# Patient Record
Sex: Female | Born: 1985 | Race: Black or African American | Hispanic: No | Marital: Single | Smoking: Never smoker
Health system: Southern US, Community
[De-identification: ages and names within clinical notes are randomized; demographics above are authoritative.]

## PROBLEM LIST (undated history)

## (undated) ENCOUNTER — Inpatient Hospital Stay (HOSPITAL_COMMUNITY): Payer: Self-pay

## (undated) DIAGNOSIS — L700 Acne vulgaris: Secondary | ICD-10-CM

## (undated) DIAGNOSIS — IMO0002 Reserved for concepts with insufficient information to code with codable children: Secondary | ICD-10-CM

## (undated) DIAGNOSIS — Z789 Other specified health status: Secondary | ICD-10-CM

## (undated) HISTORY — DX: Reserved for concepts with insufficient information to code with codable children: IMO0002

## (undated) HISTORY — PX: ROOT CANAL: SHX2363

## (undated) HISTORY — DX: Acne vulgaris: L70.0

## (undated) HISTORY — PX: COLPOSCOPY: SHX161

---
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
## 2007-05-18 ENCOUNTER — Emergency Department (HOSPITAL_COMMUNITY): Admission: EM | Admit: 2007-05-18 | Discharge: 2007-05-18 | Payer: Self-pay | Admitting: Emergency Medicine

## 2007-12-08 ENCOUNTER — Ambulatory Visit: Payer: Self-pay | Admitting: Family Medicine

## 2008-01-11 ENCOUNTER — Ambulatory Visit: Payer: Self-pay | Admitting: Family Medicine

## 2008-01-11 ENCOUNTER — Other Ambulatory Visit: Admission: RE | Admit: 2008-01-11 | Discharge: 2008-01-11 | Payer: Self-pay | Admitting: Family Medicine

## 2008-01-19 ENCOUNTER — Ambulatory Visit: Payer: Self-pay | Admitting: Family Medicine

## 2008-10-14 ENCOUNTER — Emergency Department (HOSPITAL_COMMUNITY): Admission: EM | Admit: 2008-10-14 | Discharge: 2008-10-14 | Payer: Self-pay | Admitting: Emergency Medicine

## 2011-03-20 ENCOUNTER — Encounter: Payer: Self-pay | Admitting: Family Medicine

## 2011-03-20 ENCOUNTER — Ambulatory Visit (INDEPENDENT_AMBULATORY_CARE_PROVIDER_SITE_OTHER): Payer: BC Managed Care – PPO | Admitting: Family Medicine

## 2011-03-20 VITALS — BP 120/76 | HR 76 | Ht 66.0 in | Wt 196.0 lb

## 2011-03-20 DIAGNOSIS — T192XXA Foreign body in vulva and vagina, initial encounter: Secondary | ICD-10-CM

## 2011-03-20 NOTE — Progress Notes (Signed)
Chief complaint:  Monday night had intercourse, thinks condom is in her vagina  HPI: Patient reports using a condom with intercourse on Monday night, but it seemed to have disappeared during intercourse.  Recognized in the midst of sex, denies that he ejaculated.  Denies any vaginal discharge, odor, itch or pelvic discomfort.    History reviewed. No pertinent past medical history.  History reviewed. No pertinent past surgical history.  History   Social History  . Marital Status: Single    Spouse Name: N/A    Number of Children: N/A  . Years of Education: N/A   Occupational History  . none    Social History Main Topics  . Smoking status: Never Smoker   . Smokeless tobacco: Never Used  . Alcohol Use: No  . Drug Use: No  . Sexually Active: Not on file   Other Topics Concern  . Not on file   Social History Narrative   Unemployed.  Lives with her father   No current outpatient prescriptions on file.  No Known Allergies  ROS: Denies fevers, nausea, vomiting, diarrhea, urinary complaints, URI symptoms or other concerns.  PHYSICAL EXAM: BP 120/76  Pulse 76  Ht 5\' 6"  (1.676 m)  Wt 196 lb (88.905 kg)  BMI 31.64 kg/m2  LMP 02/27/2011 Well developed, pleasant overweight female in no distress External genitalia normal.  BUS and vagina normal.  Bimanual exam--no cervical motion tenderness, normal uterus, no adnexal tenderness.  Condom was removed during bimanual exam  ASSESSMENT/PLAN: 1. Vaginal foreign body  PR REMOVAL OF FOREIGN BODY   Encouraged her to schedule a CPE/pap--per chart last was in 11/09 Discussed Plan B--likely not needed this time, due to lack of ejaculation and being >36 hours

## 2011-03-20 NOTE — Patient Instructions (Signed)
Continue to use condoms regularly for prevention of pregnancy and STD's.  If the condom should break, or come off, or not be used, please remember about Plan B (available behind the counter at pharmacy with a prescription), to prevent unwanted pregnancy.  Best if used within 36 hours, the earlier the better.

## 2011-04-15 ENCOUNTER — Other Ambulatory Visit (HOSPITAL_COMMUNITY)
Admission: RE | Admit: 2011-04-15 | Discharge: 2011-04-15 | Disposition: A | Discharge status: Home / Self Care | Payer: BC Managed Care – PPO | Source: Ambulatory Visit | Attending: Medical | Admitting: Medical

## 2011-04-15 ENCOUNTER — Ambulatory Visit (INDEPENDENT_AMBULATORY_CARE_PROVIDER_SITE_OTHER): Payer: BC Managed Care – PPO | Admitting: Medical

## 2011-04-15 ENCOUNTER — Encounter: Payer: Self-pay | Admitting: Medical

## 2011-04-15 VITALS — BP 100/70 | HR 80 | Temp 97.9°F | Resp 16 | Ht 66.0 in | Wt 191.0 lb

## 2011-04-15 DIAGNOSIS — Z01419 Encounter for gynecological examination (general) (routine) without abnormal findings: Secondary | ICD-10-CM | POA: Insufficient documentation

## 2011-04-15 DIAGNOSIS — Z87898 Personal history of other specified conditions: Secondary | ICD-10-CM | POA: Insufficient documentation

## 2011-04-15 DIAGNOSIS — Z8742 Personal history of other diseases of the female genital tract: Secondary | ICD-10-CM

## 2011-04-15 DIAGNOSIS — Z1159 Encounter for screening for other viral diseases: Secondary | ICD-10-CM | POA: Insufficient documentation

## 2011-04-15 DIAGNOSIS — L708 Other acne: Secondary | ICD-10-CM

## 2011-04-15 DIAGNOSIS — Z113 Encounter for screening for infections with a predominantly sexual mode of transmission: Secondary | ICD-10-CM | POA: Insufficient documentation

## 2011-04-15 DIAGNOSIS — Z7185 Encounter for immunization safety counseling: Secondary | ICD-10-CM

## 2011-04-15 DIAGNOSIS — Z7189 Other specified counseling: Secondary | ICD-10-CM | POA: Insufficient documentation

## 2011-04-15 DIAGNOSIS — Z0000 Encounter for general adult medical examination without abnormal findings: Secondary | ICD-10-CM

## 2011-04-15 DIAGNOSIS — L709 Acne, unspecified: Secondary | ICD-10-CM

## 2011-04-15 LAB — POCT URINALYSIS DIPSTICK
Bilirubin, UA: NEGATIVE
Blood, UA: NEGATIVE
Glucose, UA: NEGATIVE
Spec Grav, UA: <=1.005
Urobilinogen, UA: NEGATIVE

## 2011-04-15 LAB — CBC WITH DIFFERENTIAL/PLATELET
Basophils Absolute: 0 10*3/uL (ref 0.0–0.1)
Eosinophils Absolute: 0.1 10*3/uL (ref 0.0–0.7)
Eosinophils Relative: 1 % (ref 0–5)
HCT: 39.3 % (ref 36.0–46.0)
Lymphocytes Relative: 42 % (ref 12–46)
MCH: 29.3 pg (ref 26.0–34.0)
MCHC: 33.6 g/dL (ref 30.0–36.0)
MCV: 87.3 fL (ref 78.0–100.0)
Monocytes Absolute: 0.4 10*3/uL (ref 0.1–1.0)
Platelets: 332 10*3/uL (ref 150–400)
RDW: 12.3 % (ref 11.5–15.5)

## 2011-04-15 LAB — COMPREHENSIVE METABOLIC PANEL
Albumin: 4.4 g/dL (ref 3.5–5.2)
BUN: 7 mg/dL (ref 6–23)
CO2: 22 mEq/L (ref 19–32)
Chloride: 104 mEq/L (ref 96–112)
Glucose, Bld: 82 mg/dL (ref 70–99)
Potassium: 4.2 mEq/L (ref 3.5–5.3)

## 2011-04-15 LAB — LIPID PANEL
HDL: 38 mg/dL — ABNORMAL LOW (ref >39)
LDL Cholesterol: 114 mg/dL — ABNORMAL HIGH (ref 0–99)

## 2011-04-15 MED ORDER — MINOCYCLINE HCL 100 MG PO TABS: 100.0000 mg | ORAL_TABLET | Freq: Two times a day (BID) | ORAL | Status: AC

## 2011-04-15 NOTE — Progress Notes (Addendum)
Subjective:   HPI  Kayla Reilly is a 26 y.o. female who presents for a complete physical.  Doing well in general, but would like medication for acne.  She notes ongoing problems with acne, scarring, red bumps at times, on face, chest and back.  She saw dermatology once for this, but couldn't afford the medication.  Using OTC soaps and black ambi soap.  She notes hx/o abnormal pap smears, saw gynecology, had colposcopy and then things came back normal.  Last pap 2009.    Reviewed their medical, surgical, family, social, medication, and allergy history and updated chart as appropriate.  Past Medical History  Diagnosis Date  . Acne vulgaris   . Abnormal Pap smear     prior    Past Surgical History  Procedure Date  . Root canal   . Colposcopy     Family History  Problem Relation Age of Onset  . Hypertension Mother   . Diabetes Mother   . Cancer Maternal Grandmother     cancer  . Heart disease Neg Hx   . Stroke Neg Hx     History   Social History  . Marital Status: Single    Spouse Name: N/A    Number of Children: N/A  . Years of Education: N/A   Occupational History  . none    Social History Main Topics  . Smoking status: Never Smoker   . Smokeless tobacco: Never Used  . Alcohol Use: No  . Drug Use: No  . Sexually Active: Not on file   Other Topics Concern  . Not on file   Social History Narrative   Unemployed.  Lives with her father.  Not exercising.  Wants to go back to school for psychology.    No current outpatient prescriptions on file prior to visit.    No Known Allergies  Review of Systems Constitutional: -fever, -chills, -sweats, -unexpected weight change, -anorexia, -fatigue Allergy: -sneezing, -itching, -congestion Dermatology: denies changing moles, rash, lumps, new worrisome lesions ENT: -runny nose, -ear pain, -sore throat, -hoarseness, -sinus pain, -teeth pain, -tinnitus, -hearing loss, -epistaxis Cardiology:  -chest pain, -palpitations,  -edema, -orthopnea, -paroxysmal nocturnal dyspnea Respiratory: -cough, -shortness of breath, -dyspnea on exertion, -wheezing, -hemoptysis Gastroenterology: -abdominal pain, -nausea, -vomiting, -diarrhea, -constipation, -blood in stool, -changes in bowel movement, -dysphagia Hematology: -bleeding or bruising problems Musculoskeletal: -arthralgias, -myalgias, -joint swelling, -back pain, -neck pain, -cramping, -gait changes Ophthalmology: -vision changes, -eye redness, -itching, -discharge Urology: -dysuria, -difficulty urinating, -hematuria, -urinary frequency, -urgency, incontinence Neurology: -headache, -weakness, -tingling, -numbness, -speech abnormality, -memory loss, -falls, -dizziness Psychology:  -depressed mood, -agitation, -sleep problems      Objective:   Physical Exam  Filed Vitals:   04/15/11 1344  BP: 100/70  Pulse: 80  Temp: 97.9 F (36.6 C)  Resp: 16    General appearance: alert, no distress, WD/WN, overweight black female Skin: scarring on face and arms and upper chest from likely acne vulgaris, several small erythematous papular lesions in similar area of face and chest and arms HEENT: normocephalic, conjunctiva/corneas normal, sclerae anicteric, PERRLA, EOMi, nares patent, no discharge or erythema, pharynx normal Oral cavity: MMM, tongue normal, teeth normal Neck: supple, no lymphadenopathy, no thyromegaly, no masses, normal ROM Chest: non tender, normal shape and expansion Heart: RRR, normal S1, S2, no murmurs Lungs: CTA bilaterally, no wheezes, rhonchi, or rales Abdomen: +bs, soft, non tender, non distended, no masses, no hepatomegaly, no splenomegaly, no bruits Back: non tender, normal ROM, no scoliosis Musculoskeletal: upper extremities non tender,  no obvious deformity, normal ROM throughout, lower extremities non tender, no obvious deformity, normal ROM throughout Extremities: no edema, no cyanosis, no clubbing Pulses: 2+ symmetric, upper and lower  extremities, normal cap refill Neurological: alert, oriented x 3, CN2-12 intact, strength normal upper extremities and lower extremities, sensation normal throughout, DTRs 2+ throughout, no cerebellar signs, gait normal Psychiatric: normal affect, behavior normal, pleasant  Breast: nontender, no masses or lumps, no skin changes, no nipple discharge or inversion, no axillary lymphadenopathy Gyn: Normal external genitalia without lesions, vagina with normal mucosa, cervix with erythema, no cervical motion tenderness, whitish discharge.  Uterus and adnexa not enlarged, nontender, no masses.  Pap performed.  Exam chaperoned by nurse. Rectal: deferred    Assessment and Plan :    Encounter Diagnoses  Name Primary?  . Routine general medical examination at a health care facility Yes  . Acne   . History of abnormal Pap smear   . Screen for STD (sexually transmitted disease)   . HPV vaccine counseling      Physical exam - discussed healthy lifestyle, diet, exercise, preventative care, vaccinations, and addressed their concerns.  Handout given.  She was not interested in OCPs at this time.  Acne - trial of Minocycline, discussed daily facial and chest wash, OTC Benzoyl peroxide cleanser, recheck 31mo.  Records request from gynecology.   Pap smear today.  STD screening today, discussed safe sex.  Strongly recommended HPV vaccine today.   Discussed risks/ benefits, but she declines vaccines.    Follow-up pending labs.

## 2011-04-15 NOTE — Patient Instructions (Addendum)
Acne Acne is a common skin problem. Up to 80% of people get acne at some time, especially from ages 27 years to 24 years. Acne occurs when oil glands get blocked, become red (inflamed) or infected.  CAUSES  Hair follicles have glands that make an oily material called sebum. Acne happens when these glands get plugged with sebum and skin cells. Germs (bacteria) that are normally found in the oil glands can multiply. The main cause of acne is the change in hormones during adolescence. This causes the oil glands to get bigger and to make more sebum.  Other causes or things that can make acne worse include:  Hormone changes with women's menstrual cycles.   Oil based cosmetics and hair products.   Harshly scrubbing the skin.   Strong soaps or astringents.   Stress.   Heredity.   Hormone problems due to certain diseases.   Long or oily hair rubbing against the skin.   Some medicines.   Pressure from headbands, backpacks, or shoulder pads.   Job exposure to certain oils and chemicals.  SYMPTOMS  Acne often occurs where there are concentrations of oil glands, such as the face, neck, chest, and upper back. Symptoms often include:  Red pimples.   Whiteheads.   Blackheads.   Small pus-filled pimples (pustules).   Bigger red pimples or pustules that cause tenderness.  More severe acne can cause:  Boils.   Fluid filled swellings (cysts).   Scars.  HOME CARE INSTRUCTIONS  Acne usually disappears with time. Good skin care is the most important part of treatment:  Wash the skin gently at least twice a day and after exercise. Always wash your skin before bed.   Use mild soap.   After each wash, apply a non-oil based skin moisturizer. (Especially if you have dry skin).   Keep hair clean and off the face, and shampoo it daily.   Only use treatments prescribed by your caregiver.   Use a good sun block (SPF over 15). This is especially important when you use acne medicines.    Be patient with treatments. It can take 2 months before acne gets better.   Use cosmetics that are noncomedogenic. This means that they do not plug the oil glands.   Avoid things that make acne worse:   Leaning your chin or forehead on your hands.   Picking or squeezing pimples.  TREATMENT  There are many good treatments for acne. Some are available over-the-counter and some are available with a prescription. Treatment depends on:  The type of acne, such as red pimples or whiteheads.   How severe the acne is.  Common treatments include:  Creams and lotions that:   Prevent clogging of oil glands.   Treat or prevent infection and inflammation.   Antibiotics, put on the skin or taken as a pill.   Pills that decrease sebum production.   Birth control pills.   Special lights or lasers.   Minor surgery.   Injection of medicine into acne areas.   Chemicals that cause peeling of the skin.  SEEK MEDICAL CARE IF:   Acne is not better in 8 weeks.   Acne is worse.   A larger area of skin that is red or tender (may mean infection).  Document Released: 02/16/2000 Document Revised: 10/31/2010 Document Reviewed: 02/09/2008 [facility] Patient Information 2012 [place], [place].     Preventative Care for Adults - Female      MAINTAIN REGULAR HEALTH EXAMS:  A routine yearly  physical is a good way to check in with your primary care provider about your health and preventive screening. It is also an opportunity to share updates about your health and any concerns you have, and receive a thorough all-over exam.   Most health insurance companies pay for at least some preventative services.  Check with your health plan for specific coverages.  WHAT PREVENTATIVE SERVICES DO WOMEN NEED?  Adult women should have their weight and blood pressure checked regularly.   Women age 49 and older should have their cholesterol levels checked regularly.  Women should be screened for cervical  cancer with a Pap smear and pelvic exam beginning at either age 38, or 3 years after they become sexually activity.    Breast cancer screening generally begins at age 75 with a mammogram and breast exam by your primary care provider.    Beginning at age 44 and continuing to age 8, women should be screened for colorectal cancer.  Certain people may need continued testing until age 64.  Updating vaccinations is part of preventative care.  Vaccinations help protect against diseases such as the flu.  Osteoporosis is a disease in which the bones lose minerals and strength as we age. Women ages 62 and over should discuss this with their caregivers, as should women after menopause who have other risk factors.  Lab tests are generally done as part of preventative care to screen for anemia and blood disorders, to screen for problems with the kidneys and liver, to screen for bladder problems, to check blood sugar, and to check your cholesterol level.  Preventative services generally include counseling about diet, exercise, avoiding tobacco, drugs, excessive alcohol consumption, and sexually transmitted infections.    GENERAL RECOMMENDATIONS FOR GOOD HEALTH:  Healthy diet:  Eat a variety of foods, including fruit, vegetables, animal or vegetable protein, such as meat, fish, chicken, and eggs, or beans, lentils, tofu, and grains, such as rice.  Drink plenty of water daily.  Decrease saturated fat in the diet, avoid lots of red meat, processed foods, sweets, fast foods, and fried foods.  Exercise:  Aerobic exercise helps maintain good heart health. At least 30-40 minutes of moderate-intensity exercise is recommended. For example, a brisk walk that increases your heart rate and breathing. This should be done on most days of the week.   Find a type of exercise or a variety of exercises that you enjoy so that it becomes a part of your daily life.  Examples are running, walking, swimming, water aerobics,  and biking.  For motivation and support, explore group exercise such as aerobic class, spin class, Zumba, Yoga,or  martial arts, etc.    Set exercise goals for yourself, such as a certain weight goal, walk or run in a race such as a 5k walk/run.  Speak to your primary care provider about exercise goals.  Disease prevention:  If you smoke or chew tobacco, find out from your caregiver how to quit. It can literally save your life, no matter how long you have been a tobacco user. If you do not use tobacco, never begin.   Maintain a healthy diet and normal weight. Increased weight leads to problems with blood pressure and diabetes.   The Body Mass Index or BMI is a way of measuring how much of your body is fat. Having a BMI above 27 increases the risk of heart disease, diabetes, hypertension, stroke and other problems related to obesity. Your caregiver can help determine your BMI and based  on it develop an exercise and dietary program to help you achieve or maintain this important measurement at a healthful level.  High blood pressure causes heart and blood vessel problems.  Persistent high blood pressure should be treated with medicine if weight loss and exercise do not work.   Fat and cholesterol leaves deposits in your arteries that can block them. This causes heart disease and vessel disease elsewhere in your body.  If your cholesterol is found to be high, or if you have heart disease or certain other medical conditions, then you may need to have your cholesterol monitored frequently and be treated with medication.   Ask if you should have a cardiac stress test if your history suggests this. A stress test is a test done on a treadmill that looks for heart disease. This test can find disease prior to there being a problem.  Menopause can be associated with physical symptoms and risks. Hormone replacement therapy is available to decrease these. You should talk to your caregiver about whether starting  or continuing to take hormones is right for you.   Osteoporosis is a disease in which the bones lose minerals and strength as we age. This can result in serious bone fractures. Risk of osteoporosis can be identified using a bone density scan. Women ages 38 and over should discuss this with their caregivers, as should women after menopause who have other risk factors. Ask your caregiver whether you should be taking a calcium supplement and Vitamin D, to reduce the rate of osteoporosis.   Avoid drinking alcohol in excess (more than two drinks per day).  Avoid use of street drugs. Do not share needles with anyone. Ask for professional help if you need assistance or instructions on stopping the use of alcohol, cigarettes, and/or drugs.  Brush your teeth twice a day with fluoride toothpaste, and floss once a day. Good oral hygiene prevents tooth decay and gum disease. The problems can be painful, unattractive, and can cause other health problems. Visit your dentist for a routine oral and dental check up and preventive care every 6-12 months.   Look at your skin regularly.  Use a mirror to look at your back. Notify your caregivers of changes in moles, especially if there are changes in shapes, colors, a size larger than a pencil eraser, an irregular border, or development of new moles.  Safety:  Use seatbelts 100% of the time, whether driving or as a passenger.  Use safety devices such as hearing protection if you work in environments with loud noise or significant background noise.  Use safety glasses when doing any work that could send debris in to the eyes.  Use a helmet if you ride a bike or motorcycle.  Use appropriate safety gear for contact sports.  Talk to your caregiver about gun safety.  Use sunscreen with a SPF (or skin protection factor) of 15 or greater.  Lighter skinned people are at a greater risk of skin cancer. Don't forget to also wear sunglasses in order to protect your eyes from too much  damaging sunlight. Damaging sunlight can accelerate cataract formation.   Practice safe sex. Use condoms. Condoms are used for birth control and to help reduce the spread of sexually transmitted infections (or STIs).  Some of the STIs are gonorrhea (the clap), chlamydia, syphilis, trichomonas, herpes, HPV (human papilloma virus) and HIV (human immunodeficiency virus) which causes AIDS. The herpes, HIV and HPV are viral illnesses that have no cure. These can result  in disability, cancer and death.   Keep carbon monoxide and smoke detectors in your home functioning at all times. Change the batteries every 6 months or use a model that plugs into the wall.   Vaccinations:  Stay up to date with your tetanus shots and other required immunizations. You should have a booster for tetanus every 10 years. Be sure to get your flu shot every year, since 5%-20% of the [place] population comes down with the flu. The flu vaccine changes each year, so being vaccinated once is not enough. Get your shot in the fall, before the flu season peaks.   Other vaccines to consider:  Human Papilloma Virus or HPV causes cancer of the cervix, and other infections that can be transmitted from person to person. There is a vaccine for HPV, and females should get immunized between the ages of 21 and 98. It requires a series of 3 shots.   Pneumococcal vaccine to protect against certain types of pneumonia.  This is normally recommended for adults age 24 or older.  However, adults younger than 24 years old with certain underlying conditions such as diabetes, heart or lung disease should also receive the vaccine.  Shingles vaccine to protect against Varicella Zoster if you are older than age 22, or younger than 24 years old with certain underlying illness.  Hepatitis A vaccine to protect against a form of infection of the liver by a virus acquired from food.  Hepatitis B vaccine to protect against a form of infection of the liver by a  virus acquired from blood or body fluids, particularly if you work in health care.  If you plan to travel internationally, check with your local health department for specific vaccination recommendations.  Cancer Screening:  Breast cancer screening is essential to preventive care for women. All women age 82 and older should perform a breast self-exam every month. At age 40 and older, women should have their caregiver complete a breast exam each year. Women at ages 41 and older should have a mammogram (x-ray film) of the breasts. Your caregiver can discuss how often you need mammograms.    Cervical cancer screening includes taking a Pap smear (sample of cells examined under a microscope) from the cervix (end of the uterus). It also includes testing for HPV (Human Papilloma Virus, which can cause cervical cancer). Screening and a pelvic exam should begin at age 29, or 3 years after a woman becomes sexually active. Screening should occur every year, with a Pap smear but no HPV testing, up to age 36. After age 96, you should have a Pap smear every 3 years with HPV testing, if no HPV was found previously.   Most routine colon cancer screening begins at the age of 61. On a yearly basis, doctors may provide special easy to use take-home tests to check for hidden blood in the stool. Sigmoidoscopy or colonoscopy can detect the earliest forms of colon cancer and is life saving. These tests use a small camera at the end of a tube to directly examine the colon. Speak to your caregiver about this at age 51, when routine screening begins (and is repeated every 5 years unless early forms of pre-cancerous polyps or small growths are found).

## 2011-04-16 LAB — TSH: TSH: 0.966 u[IU]/mL (ref 0.350–4.500)

## 2011-04-16 LAB — HIV ANTIBODY (ROUTINE TESTING W REFLEX): HIV: NONREACTIVE

## 2011-04-17 ENCOUNTER — Telehealth: Payer: Self-pay | Admitting: Family Medicine

## 2011-04-17 NOTE — Telephone Encounter (Signed)
Done

## 2011-04-19 ENCOUNTER — Other Ambulatory Visit: Payer: Self-pay | Admitting: Medical

## 2011-04-19 MED ORDER — DOXYCYCLINE HYCLATE 100 MG PO TABS: 100.0000 mg | ORAL_TABLET | Freq: Two times a day (BID) | ORAL | Status: AC

## 2011-04-19 MED ORDER — METRONIDAZOLE 500 MG PO TABS: 500.0000 mg | ORAL_TABLET | Freq: Two times a day (BID) | ORAL | Status: AC

## 2011-07-02 ENCOUNTER — Encounter: Payer: Self-pay | Admitting: Obstetrics and Gynecology

## 2011-08-22 ENCOUNTER — Ambulatory Visit: Payer: BC Managed Care – PPO | Admitting: Family Medicine

## 2011-10-06 ENCOUNTER — Encounter (HOSPITAL_COMMUNITY): Payer: Self-pay | Admitting: Emergency Medicine

## 2011-10-06 ENCOUNTER — Emergency Department (HOSPITAL_COMMUNITY)
Admission: EM | Admit: 2011-10-06 | Discharge: 2011-10-07 | Disposition: A | Discharge status: Home / Self Care | Payer: BC Managed Care – PPO | Attending: Emergency Medicine | Admitting: Emergency Medicine

## 2011-10-06 DIAGNOSIS — T7840XA Allergy, unspecified, initial encounter: Secondary | ICD-10-CM

## 2011-10-06 DIAGNOSIS — R21 Rash and other nonspecific skin eruption: Secondary | ICD-10-CM | POA: Insufficient documentation

## 2011-10-06 DIAGNOSIS — L509 Urticaria, unspecified: Secondary | ICD-10-CM | POA: Insufficient documentation

## 2011-10-06 MED ORDER — DIPHENHYDRAMINE HCL 25 MG PO CAPS
25.0000 mg | ORAL_CAPSULE | Freq: Once | ORAL | Status: AC
  Administered 2011-10-07: 25 mg via ORAL
  Filled 2011-10-06: qty 1

## 2011-10-06 MED ORDER — FAMOTIDINE 20 MG PO TABS
20.0000 mg | ORAL_TABLET | Freq: Once | ORAL | Status: AC
  Administered 2011-10-07: 20 mg via ORAL
  Filled 2011-10-06: qty 1

## 2011-10-06 MED ORDER — PREDNISONE 20 MG PO TABS
40.0000 mg | ORAL_TABLET | Freq: Once | ORAL | Status: AC
  Administered 2011-10-07: 40 mg via ORAL
  Filled 2011-10-06: qty 2

## 2011-10-06 NOTE — ED Notes (Signed)
Pt c/o swollen areas to R FA, hives noted with c/o itching onset 0500. PWD

## 2011-10-06 NOTE — ED Provider Notes (Signed)
History     CSN: [id number]  Arrival date & time 10/06/11  2028   First MD Initiated Contact with Patient 10/06/11 2225      Chief Complaint  Patient presents with  . Urticaria    (Consider location/radiation/quality/duration/timing/severity/associated sxs/prior treatment) HPI Comments: Patient reports that she noticed hives on her arms and trunk yesterday.  She has not tried taking Benadryl or anything else for her symptoms.  She denies any feeling of her throat closing.  No swelling of her lips or tongue.  No shortness of breath.  She denies any new lotions, soaps, or detergents.  No new foods.  No new medications.  No prior history of allergic reactions.    The history is provided by the patient.    Past Medical History  Diagnosis Date  . Acne vulgaris   . Abnormal Pap smear     prior    Past Surgical History  Procedure Date  . Root canal   . Colposcopy     Family History  Problem Relation Age of Onset  . Hypertension Mother   . Diabetes Mother   . Cancer Maternal Grandmother     cancer  . Heart disease Neg Hx   . Stroke Neg Hx     History  Substance Use Topics  . Smoking status: Never Smoker   . Smokeless tobacco: Never Used  . Alcohol Use: No    OB History    Grav Para Term Preterm Abortions TAB SAB Ect Mult Living   0 0 0 0 0 0 0 0 0 0       Review of Systems  Constitutional: Negative for fever and chills.  HENT: Negative for sore throat, facial swelling, trouble swallowing and voice change.   Gastrointestinal: Negative for nausea and vomiting.  Musculoskeletal: Negative for joint swelling.  Skin: Positive for rash.    Allergies  Review of patient's allergies indicates no known allergies.  Home Medications  No current outpatient prescriptions on file.  BP 145/97  Pulse 102  Temp 99 F (37.2 C) (Oral)  Resp 18  SpO2 99%  LMP 09/26/2011  Physical Exam  Nursing note and vitals reviewed. Constitutional: She is oriented to person, place,  and time. She appears well-developed and well-nourished. No distress.  HENT:  Head: Normocephalic and atraumatic.  Mouth/Throat: Oropharynx is clear and moist and mucous membranes are normal.       No sign of airway obstruction. No edema of face, eyelids, lips, tongue, uvula.Marland Kitchen Uvula midline, no nasal congestion or drooling.  Tongue not elevated. No trismus.  Neck: Trachea normal, normal range of motion and full passive range of motion without pain. Neck supple.       No stridor  Cardiovascular: Normal rate, regular rhythm, intact distal pulses and normal pulses.        Not tachycardic  Pulmonary/Chest: Effort normal and breath sounds normal. No stridor. She has no wheezes.  Musculoskeletal: Normal range of motion.  Neurological: She is alert and oriented to person, place, and time.  Skin: Skin is warm and intact. Rash noted. Rash is urticarial. She is not diaphoretic.       Not diaphoretic. Raised erythematous welts, pruritic in nature, located on both arms and the trunk. Blanchable urticaria, no petechiae or purpura.   Psychiatric: She has a normal mood and affect. Her behavior is normal.    ED Course  Procedures (including critical care time)  Labs Reviewed - No data to display No results found.  No diagnosis found.    MDM  Patient re-evaluated prior to dc, is hemodynamically stable, in no respiratory distress, and denies the feeling of throat closing. Pt has been advised to take OTC benadryl & return to the ED if they have a mod-severe allergic rxn (s/s including throat closing, difficulty breathing, swelling of lips face or tongue). Pt is to follow up with their PCP. Pt is agreeable with plan & verbalizes understanding.  Patient discharged home with short course of Prednisone.        Pascal Lux [place], PA-C 10/07/11 [phone]

## 2011-10-07 MED ORDER — PREDNISONE 20 MG PO TABS: 40.0000 mg | ORAL_TABLET | Freq: Every day | ORAL | Status: AC

## 2011-10-07 NOTE — ED Provider Notes (Signed)
Medical screening examination/treatment/procedure(s) were performed by non-physician practitioner and as supervising physician I was immediately available for consultation/collaboration.  Doug Sou, MD 10/07/11 1500

## 2013-01-01 ENCOUNTER — Other Ambulatory Visit (HOSPITAL_COMMUNITY)
Admission: RE | Admit: 2013-01-01 | Discharge: 2013-01-01 | Disposition: A | Discharge status: Home / Self Care | Payer: BC Managed Care – PPO | Source: Ambulatory Visit | Attending: Medical | Admitting: Medical

## 2013-01-01 ENCOUNTER — Ambulatory Visit (INDEPENDENT_AMBULATORY_CARE_PROVIDER_SITE_OTHER): Payer: BC Managed Care – PPO | Admitting: Medical

## 2013-01-01 ENCOUNTER — Encounter: Payer: Self-pay | Admitting: Medical

## 2013-01-01 VITALS — BP 112/80 | HR 87 | Temp 98.1°F | Resp 16 | Ht 66.0 in | Wt 196.0 lb

## 2013-01-01 DIAGNOSIS — B977 Papillomavirus as the cause of diseases classified elsewhere: Secondary | ICD-10-CM

## 2013-01-01 DIAGNOSIS — Z8742 Personal history of other diseases of the female genital tract: Secondary | ICD-10-CM

## 2013-01-01 DIAGNOSIS — N760 Acute vaginitis: Secondary | ICD-10-CM | POA: Insufficient documentation

## 2013-01-01 DIAGNOSIS — Z113 Encounter for screening for infections with a predominantly sexual mode of transmission: Secondary | ICD-10-CM

## 2013-01-01 DIAGNOSIS — Z1151 Encounter for screening for human papillomavirus (HPV): Secondary | ICD-10-CM | POA: Insufficient documentation

## 2013-01-01 DIAGNOSIS — IMO0002 Reserved for concepts with insufficient information to code with codable children: Secondary | ICD-10-CM

## 2013-01-01 DIAGNOSIS — L708 Other acne: Secondary | ICD-10-CM

## 2013-01-01 DIAGNOSIS — Z01419 Encounter for gynecological examination (general) (routine) without abnormal findings: Secondary | ICD-10-CM | POA: Insufficient documentation

## 2013-01-01 DIAGNOSIS — Z124 Encounter for screening for malignant neoplasm of cervix: Secondary | ICD-10-CM

## 2013-01-01 DIAGNOSIS — L709 Acne, unspecified: Secondary | ICD-10-CM

## 2013-01-01 DIAGNOSIS — Z0000 Encounter for general adult medical examination without abnormal findings: Secondary | ICD-10-CM

## 2013-01-01 DIAGNOSIS — Z87898 Personal history of other specified conditions: Secondary | ICD-10-CM

## 2013-01-01 DIAGNOSIS — E669 Obesity, unspecified: Secondary | ICD-10-CM | POA: Insufficient documentation

## 2013-01-01 DIAGNOSIS — R8789 Other abnormal findings in specimens from female genital organs: Secondary | ICD-10-CM

## 2013-01-01 LAB — POCT URINALYSIS DIPSTICK
Ketones, UA: NEGATIVE
Leukocytes, UA: NEGATIVE
Protein, UA: NEGATIVE
Spec Grav, UA: 1.01
Urobilinogen, UA: NEGATIVE
pH, UA: 6

## 2013-01-01 LAB — CBC WITH DIFFERENTIAL/PLATELET
Basophils Absolute: 0 10*3/uL (ref 0.0–0.1)
Basophils Relative: 0 % (ref 0–1)
Eosinophils Relative: 2 % (ref 0–5)
HCT: 38.7 % (ref 36.0–46.0)
MCHC: 33.9 g/dL (ref 30.0–36.0)
MCV: 87.2 fL (ref 78.0–100.0)
Monocytes Absolute: 0.4 10*3/uL (ref 0.1–1.0)
RDW: 12.9 % (ref 11.5–15.5)

## 2013-01-01 LAB — COMPREHENSIVE METABOLIC PANEL
AST: 13 U/L (ref 0–37)
Alkaline Phosphatase: 54 U/L (ref 39–117)
BUN: 9 mg/dL (ref 6–23)
Creat: 0.72 mg/dL (ref 0.50–1.10)
Total Bilirubin: 0.3 mg/dL (ref 0.3–1.2)

## 2013-01-01 MED ORDER — MINOCYCLINE HCL 100 MG PO CAPS: 100.0000 mg | ORAL_CAPSULE | Freq: Two times a day (BID) | ORAL | Status: DC

## 2013-01-01 MED ORDER — CLINDAMYCIN PHOS-BENZOYL PEROX 1-5 % EX GEL: Freq: Two times a day (BID) | CUTANEOUS | Status: DC

## 2013-01-01 NOTE — Progress Notes (Signed)
Subjective:   HPI  Kayla Reilly is a 27 y.o. female who presents for a complete physical.  Last physical 04/2011.  Been in usual state of health.     Preventative care: Last ophthalmology visit:n/a Last dental visit:n/a Last colonoscopy:n/.a Last mammogram:n/a Last gynecological exam:04/2011 Last EKG: Last labs:04/2011  Prior vaccinations: TD or Tdap:up to date Influenza:will get at school Pneumococcal:n/a Shingles/Zostavax:n/a  Concerns: Acne.   Last visit did well on Minocycline.  Would like to go back on this.   Reviewed their medical, surgical, family, social, medication, and allergy history and updated chart as appropriate.  Past Medical History  Diagnosis Date  . Acne vulgaris   . Abnormal Pap smear     prior    Past Surgical History  Procedure Laterality Date  . Root canal    . Colposcopy      History   Social History  . Marital Status: Single    Spouse Name: N/A    Number of Children: N/A  . Years of Education: N/A   Occupational History  . none    Social History Main Topics  . Smoking status: Never Smoker   . Smokeless tobacco: Never Used  . Alcohol Use: No  . Drug Use: No  . Sexual Activity: Not on file   Other Topics Concern  . Not on file   Social History Narrative   Unemployed.  Lives with her father.  Walking for exercise.  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  One significant other x 1 year as of 10/14.    Family History  Problem Relation Age of Onset  . Hypertension Mother   . Diabetes Mother   . Cancer Maternal Grandmother     cancer  . Heart disease Neg Hx   . Stroke Neg Hx     No current outpatient prescriptions on file.  No Known Allergies   Review of Systems Constitutional: -fever, -chills, -sweats, -unexpected weight change, -decreased appetite, -fatigue Allergy: -sneezing, -itching, -congestion Dermatology: -changing moles, --rash, -lumps ENT: -runny nose, -ear pain, -sore throat,  -hoarseness, -sinus pain, -teeth pain, - ringing in ears, -hearing loss, -nosebleeds Cardiology: -chest pain, -palpitations, -swelling, -difficulty breathing when lying flat, -waking up short of breath Respiratory: -cough, -shortness of breath, -difficulty breathing with exercise or exertion, -wheezing, -coughing up blood Gastroenterology: -abdominal pain, -nausea, -vomiting, -diarrhea, -constipation, -blood in stool, -changes in bowel movement, -difficulty swallowing or eating Hematology: -bleeding, -bruising  Musculoskeletal: -joint aches, -muscle aches, -joint swelling, -back pain, -neck pain, -cramping, -changes in gait Ophthalmology: denies vision changes, eye redness, itching, discharge Urology: -burning with urination, -difficulty urinating, -blood in urine, -urinary frequency, -urgency, -incontinence Neurology: -headache, -weakness, -tingling, -numbness, -memory loss, -falls, -dizziness Psychology: -depressed mood, -agitation, -sleep problems     Objective:   Physical Exam  BP 112/80  Pulse 87  Temp(Src) 98.1 F (36.7 C) (Oral)  Resp 16  Ht 5\' 6"  (1.676 m)  Wt 196 lb (88.905 kg)  BMI 31.65 kg/m2  LMP 12/25/2012  General appearance: alert, no distress, WD/WN, obese AA female Skin: significant facial acne, comedones, non inflammatory, no erythema, but lots of scarring from prior ance too HEENT: normocephalic, conjunctiva/corneas normal, sclerae anicteric, PERRLA, EOMi, nares patent, no discharge or erythema, pharynx normal Oral cavity: MMM, tongue normal, teeth in good repair Neck: supple, no lymphadenopathy, no thyromegaly, no masses, normal ROM Chest: non tender, normal shape and expansion Heart: RRR, normal S1, S2, no murmurs Lungs: CTA bilaterally, no wheezes, rhonchi, or  rales Abdomen: +bs, soft, non tender, non distended, no masses, no hepatomegaly, no splenomegaly, no bruits Back: non tender, normal ROM, no scoliosis Musculoskeletal: upper extremities non tender, no  obvious deformity, normal ROM throughout, lower extremities non tender, no obvious deformity, normal ROM throughout Extremities: no edema, no cyanosis, no clubbing Pulses: 2+ symmetric, upper and lower extremities, normal cap refill Neurological: alert, oriented x 3, CN2-12 intact, strength normal upper extremities and lower extremities, sensation normal throughout, DTRs 2+ throughout, no cerebellar signs, gait normal Psychiatric: normal affect, behavior normal, pleasant  Breast: nontender, no masses or lumps, no skin changes, no nipple discharge or inversion, no axillary lymphadenopathy Gyn: Normal external genitalia without lesions, vagina with normal mucosa, cervix without lesions, no cervical motion tenderness, no abnormal vaginal discharge.  Uterus and adnexa not enlarged, nontender, no masses.  Pap performed.  Exam chaperoned by nurse. Rectal: deferred    Assessment and Plan :    Encounter Diagnoses  Name Primary?  . Routine general medical examination at a health care facility Yes  . Obesity, unspecified   . Screen for STD (sexually transmitted disease)   . Screening for cervical cancer   . History of abnormal Pap smear   . 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 Acne     Physical exam - discussed healthy lifestyle, diet, exercise, preventative care, vaccinations, and addressed their concerns.  Handout given. Obesity - need to work on more exercise, health diet, and weight loss STD screening today Pap sent Acne - restart Minocycline, begin Clindagel Follow-up pending labs

## 2013-01-02 LAB — RPR

## 2013-01-05 ENCOUNTER — Encounter: Payer: Self-pay | Admitting: Family Medicine

## 2013-01-07 ENCOUNTER — Other Ambulatory Visit: Payer: Self-pay

## 2013-03-04 NOTE — L&D Delivery Note (Signed)
Delivery Note At 5:16 AM a viable and healthy female was delivered via Vaginal, Spontaneous Delivery (Presentation: ;  ).  APGAR: 8, 9; weight  .   Placenta status: Intact, Spontaneous.  Cord: 3 vessels with the following complications: Tight nuchal cord  Anesthesia: Epidural  Episiotomy: None Lacerations: Periurethral Suture Repair: 3.0 monocryl Est. Blood Loss (mL):    Mom to postpartum.  Baby to Couplet care / Skin to Skin.  [facility]-ErWILLIAMS,Chirstopher Iovino 03/01/2014, 5:41 AM

## 2013-09-25 ENCOUNTER — Encounter (HOSPITAL_COMMUNITY): Payer: Self-pay | Admitting: Emergency Medicine

## 2013-09-25 ENCOUNTER — Emergency Department (HOSPITAL_COMMUNITY)
Admission: EM | Admit: 2013-09-25 | Discharge: 2013-09-25 | Disposition: A | Discharge status: Home / Self Care | Payer: Medicaid Other | Attending: Emergency Medicine | Admitting: Emergency Medicine

## 2013-09-25 ENCOUNTER — Emergency Department (HOSPITAL_COMMUNITY): Payer: Medicaid Other

## 2013-09-25 DIAGNOSIS — R109 Unspecified abdominal pain: Secondary | ICD-10-CM | POA: Diagnosis not present

## 2013-09-25 DIAGNOSIS — O9989 Other specified diseases and conditions complicating pregnancy, childbirth and the puerperium: Secondary | ICD-10-CM | POA: Diagnosis present

## 2013-09-25 DIAGNOSIS — Z872 Personal history of diseases of the skin and subcutaneous tissue: Secondary | ICD-10-CM | POA: Diagnosis not present

## 2013-09-25 DIAGNOSIS — O26899 Other specified pregnancy related conditions, unspecified trimester: Secondary | ICD-10-CM

## 2013-09-25 DIAGNOSIS — N898 Other specified noninflammatory disorders of vagina: Secondary | ICD-10-CM | POA: Insufficient documentation

## 2013-09-25 LAB — URINALYSIS, ROUTINE W REFLEX MICROSCOPIC
Bilirubin Urine: NEGATIVE
GLUCOSE, UA: NEGATIVE mg/dL
HGB URINE DIPSTICK: NEGATIVE
KETONES UR: NEGATIVE mg/dL
Nitrite: NEGATIVE
PH: 7.5 (ref 5.0–8.0)
PROTEIN: NEGATIVE mg/dL
Specific Gravity, Urine: 1.023 (ref 1.005–1.030)
Urobilinogen, UA: 1 mg/dL (ref 0.0–1.0)

## 2013-09-25 LAB — COMPREHENSIVE METABOLIC PANEL
ALT: 7 U/L (ref 0–35)
ANION GAP: 12 (ref 5–15)
AST: 13 U/L (ref 0–37)
Albumin: 3.2 g/dL — ABNORMAL LOW (ref 3.5–5.2)
Alkaline Phosphatase: 62 U/L (ref 39–117)
BILIRUBIN TOTAL: 0.2 mg/dL — AB (ref 0.3–1.2)
BUN: 7 mg/dL (ref 6–23)
CALCIUM: 9.2 mg/dL (ref 8.4–10.5)
CHLORIDE: 102 meq/L (ref 96–112)
CO2: 21 meq/L (ref 19–32)
CREATININE: 0.54 mg/dL (ref 0.50–1.10)
Glucose, Bld: 84 mg/dL (ref 70–99)
Potassium: 4 mEq/L (ref 3.7–5.3)
Sodium: 135 mEq/L — ABNORMAL LOW (ref 137–147)
Total Protein: 7.8 g/dL (ref 6.0–8.3)

## 2013-09-25 LAB — CBC WITH DIFFERENTIAL/PLATELET
BASOS ABS: 0 10*3/uL (ref 0.0–0.1)
Basophils Relative: 0 % (ref 0–1)
EOS PCT: 2 % (ref 0–5)
Eosinophils Absolute: 0.1 10*3/uL (ref 0.0–0.7)
HEMATOCRIT: 35 % — AB (ref 36.0–46.0)
HEMOGLOBIN: 11.8 g/dL — AB (ref 12.0–15.0)
LYMPHS PCT: 33 % (ref 12–46)
Lymphs Abs: 2.6 10*3/uL (ref 0.7–4.0)
MCH: 30 pg (ref 26.0–34.0)
MCHC: 33.7 g/dL (ref 30.0–36.0)
MCV: 89.1 fL (ref 78.0–100.0)
MONO ABS: 0.6 10*3/uL (ref 0.1–1.0)
MONOS PCT: 8 % (ref 3–12)
NEUTROS ABS: 4.4 10*3/uL (ref 1.7–7.7)
Neutrophils Relative %: 57 % (ref 43–77)
Platelets: 267 10*3/uL (ref 150–400)
RBC: 3.93 MIL/uL (ref 3.87–5.11)
RDW: 12.9 % (ref 11.5–15.5)
WBC: 7.7 10*3/uL (ref 4.0–10.5)

## 2013-09-25 LAB — ABO/RH: ABO/RH(D): A POS

## 2013-09-25 LAB — WET PREP, GENITAL
TRICH WET PREP: NONE SEEN
YEAST WET PREP: NONE SEEN

## 2013-09-25 LAB — URINE MICROSCOPIC-ADD ON

## 2013-09-25 LAB — POC URINE PREG, ED: Preg Test, Ur: POSITIVE — AB

## 2013-09-25 LAB — HCG, QUANTITATIVE, PREGNANCY: HCG, BETA CHAIN, QUANT, S: 13929 m[IU]/mL — AB (ref <5)

## 2013-09-25 NOTE — ED Notes (Signed)
Per pt, states lower abdominal pain, above pelvis for 4 days-no N/V/D

## 2013-09-25 NOTE — Discharge Instructions (Signed)
You are 17 weeks and 5 days pregnant.  [facility] for your [facility].  You will also need a formal Ultrasound.    Abdominal Pain During Pregnancy Belly (abdominal) pain is common during pregnancy. Most of the time, it is not a serious problem. Other times, it can be a sign that something is wrong with the pregnancy. Always tell your doctor if you have belly pain. HOME CARE Monitor your belly pain for any changes. The following actions may help you feel better:  Do not have sex (intercourse) or put anything in your vagina until you feel better.  Rest until your pain stops.  Drink clear fluids if you feel sick to your stomach (nauseous). Do not eat solid food until you feel better.  Only take medicine as told by your doctor.  Keep all doctor visits as told. GET HELP RIGHT AWAY IF:   You are bleeding, leaking fluid, or pieces of tissue come out of your vagina.  You have more pain or cramping.  You keep throwing up (vomiting).  You have pain when you pee (urinate) or have blood in your pee.  You have a fever.  You do not feel your baby moving as much.  You feel very weak or feel like passing out.  You have trouble breathing, with or without belly pain.  You have a very bad headache and belly pain.  You have fluid leaking from your vagina and belly pain.  You keep having watery poop (diarrhea).  Your belly pain does not go away after resting, or the pain gets worse. MAKE SURE YOU:   Understand these instructions.  Will watch your condition.  Will get help right away if you are not doing well or get worse. Document Released: 02/06/2009 Document Revised: 10/21/2012 Document Reviewed: 09/17/2012 [facility] Patient Information 2015 [employer], [place]. This information is not intended to replace advice given to you by your health care provider. Make sure you discuss any questions you have with your health care provider.

## 2013-09-25 NOTE — ED Provider Notes (Signed)
Medical screening examination/treatment/procedure(s) were performed by non-physician practitioner and as supervising physician I was immediately available for consultation/collaboration.   EKG Interpretation None        Dagmar HaitWilliam Reisha Wos, MD 09/25/13 2229

## 2013-09-25 NOTE — ED Provider Notes (Signed)
CSN: [id number]     Arrival date & time 09/25/13  1808 History   First MD Initiated Contact with Patient 09/25/13 1828     Chief Complaint  Patient presents with  . Abdominal Pain     (Consider location/radiation/quality/duration/timing/severity/associated sxs/prior Treatment) HPI  28 year old female without a significant past medical history presents for evaluation of low abdominal pain.patient reports gradual onset of pressure sensation to the low pelvic region ongoing for the past 4 days. Pain worsening with certain positional change. Pain minimally improved with taking over-the-counter Tylenol. No associated fever, chills, chest pain, shortness of breath, productive cough, back pain, dysuria, hematuria, vaginal discharge, vaginal bleeding, rectal pain, or rectal bleeding. She admits to having unprotected sex with her fianc. She denies any pain with sexual activities. No prior history of STD. She cannot recall her last menstrual period, but states that she has irregular periods.  Past Medical History  Diagnosis Date  . Acne vulgaris   . Abnormal Pap smear     prior   Past Surgical History  Procedure Laterality Date  . Root canal    . Colposcopy     Family History  Problem Relation Age of Onset  . Hypertension Mother   . Diabetes Mother   . Cancer Maternal Grandmother     cancer  . Heart disease Neg Hx   . Stroke Neg Hx    History  Substance Use Topics  . Smoking status: Never Smoker   . Smokeless tobacco: Never Used  . Alcohol Use: No   OB History   Grav Para Term Preterm Abortions TAB SAB Ect Mult Living   0 0 0 0 0 0 0 0 0 0      Review of Systems  All other systems reviewed and are negative.     Allergies  Review of patient's allergies indicates no known allergies.  Home Medications   Prior to Admission medications   Medication Sig Start Date End Date Taking? Authorizing Provider  acetaminophen (TYLENOL) 325 MG tablet Take 650 mg by mouth every 6 (six)  hours as needed for mild pain or headache.   Yes Historical Provider, MD   BP 135/76  Pulse 76  Temp(Src) 97.6 F (36.4 C) (Oral)  Resp 18  SpO2 99%  LMP 07/13/2013 Physical Exam  Nursing note and vitals reviewed. Constitutional: She appears well-developed and well-nourished. No distress.  HENT:  Head: Normocephalic and atraumatic.  Eyes: Conjunctivae are normal.  Neck: Normal range of motion. Neck supple.  Cardiovascular: Normal rate and regular rhythm.   Pulmonary/Chest: Effort normal and breath sounds normal. She exhibits no tenderness.  Abdominal: Soft. There is no tenderness.  Genitourinary: Uterus normal. There is no rash or lesion on the right labia. There is no rash or lesion on the left labia. Cervix exhibits no motion tenderness and no discharge. Right adnexum displays tenderness. Right adnexum displays no mass. Left adnexum displays tenderness. Left adnexum displays no mass. No erythema, tenderness or bleeding around the vagina. Vaginal discharge found.  Chaperone present:  Pt has both left and right adnexal discomfort on exam.  Gravid abdomen.    Lymphadenopathy:       Right: No inguinal adenopathy present.       Left: No inguinal adenopathy present.    ED Course  Procedures (including critical care time)  7:15 PM Pt with lower abd pain.  Tenderness to bilateral adnexal region on pelvic exam.  Pregnancy test positive.  Will obtain transvaginal US to r/o ectopic.  10:22 PM Transvaginal US demonstrates an intrauterine pregnancy, estimated to be 17 weeks and 5 days.  Pt recommend to f/u closely with OBGYN for formal US and further care.  Cultures sent for further evaluation of possible infection.  Otherwise pt stable for discharge.  Return precaution discussed.  Care discussed with Dr. Gwendolyn Grant.  Labs Review Labs Reviewed  WET PREP, GENITAL - Abnormal; Notable for the following:    Clue Cells Wet Prep HPF POC FEW (*)    WBC, Wet Prep HPF POC MANY (*)    All other  components within normal limits  URINALYSIS, ROUTINE W REFLEX MICROSCOPIC - Abnormal; Notable for the following:    APPearance CLOUDY (*)    Leukocytes, UA SMALL (*)    All other components within normal limits  CBC WITH DIFFERENTIAL - Abnormal; Notable for the following:    Hemoglobin 11.8 (*)    HCT 35.0 (*)    All other components within normal limits  COMPREHENSIVE METABOLIC PANEL - Abnormal; Notable for the following:    Sodium 135 (*)    Albumin 3.2 (*)    Total Bilirubin 0.2 (*)    All other components within normal limits  HCG, QUANTITATIVE, PREGNANCY - Abnormal; Notable for the following:    hCG, Beta Chain, Quant, S 13929 (*)    All other components within normal limits  URINE MICROSCOPIC-ADD ON - Abnormal; Notable for the following:    Squamous Epithelial / LPF FEW (*)    All other components within normal limits  POC URINE PREG, ED - Abnormal; Notable for the following:    Preg Test, Ur POSITIVE (*)    All other components within normal limits  GC/CHLAMYDIA PROBE AMP  ABO/RH    Imaging Review US Ob Limited  09/25/2013   CLINICAL DATA:  Pelvic pain.  Pregnant patient.  EXAM: LIMITED OBSTETRIC ULTRASOUND  FINDINGS: Number of Fetuses: 1  Heart Rate:  133 bpm  Movement: Yes  Presentation: Transverse  Placental Location: Posterior  Previa: No  Amniotic Fluid (Subjective):  Within normal limits.  BPD:  3.8Cm.  17w  5d estimated gestational age.  MATERNAL FINDINGS:  Cervix:  Closed.  Uterus/Adnexae:  No abnormality visualized.  IMPRESSION:  Single living intrauterine gestation at 66 week 5 day gestational age.  This exam is performed on an emergent basis and does not comprehensively evaluate fetal size, dating, or anatomy; follow-up complete OB US should be considered if further fetal assessment is warranted.   Electronically Signed   By: Kennith Center M.D.   On: 09/25/2013 22:12   US Ob Transvaginal  09/25/2013   CLINICAL DATA:  Pelvic pain.  Pregnant patient.  EXAM: LIMITED  OBSTETRIC ULTRASOUND  FINDINGS: Number of Fetuses: 1  Heart Rate:  133 bpm  Movement: Yes  Presentation: Transverse  Placental Location: Posterior  Previa: No  Amniotic Fluid (Subjective):  Within normal limits.  BPD:  3.8Cm.  17w  5d estimated gestational age.  MATERNAL FINDINGS:  Cervix:  Closed.  Uterus/Adnexae:  No abnormality visualized.  IMPRESSION:  Single living intrauterine gestation at 44 week 5 day gestational age.  This exam is performed on an emergent basis and does not comprehensively evaluate fetal size, dating, or anatomy; follow-up complete OB US should be considered if further fetal assessment is warranted.   Electronically Signed   By: Kennith Center M.D.   On: 09/25/2013 22:12     EKG Interpretation None      MDM   Final diagnoses:  Abdominal  pain affecting pregnancy    BP 129/73  Pulse 82  Temp(Src) 97.4 F (36.3 C) (Oral)  Resp 18  SpO2 100%  LMP 07/13/2013  I have reviewed nursing notes and vital signs. I personally reviewed the imaging tests through PACS system  I reviewed available ER/hospitalization records thought the EMR     Fayrene Helper, [place] 09/25/13 2223

## 2013-09-25 NOTE — ED Notes (Signed)
Per lab machine is down to here to run Beta HCG, [facility]

## 2013-09-27 LAB — GC/CHLAMYDIA PROBE AMP
CT Probe RNA: NEGATIVE
GC Probe RNA: NEGATIVE

## 2013-10-14 ENCOUNTER — Ambulatory Visit (INDEPENDENT_AMBULATORY_CARE_PROVIDER_SITE_OTHER): Payer: BC Managed Care – PPO | Admitting: Obstetrics & Gynecology

## 2013-10-14 ENCOUNTER — Encounter: Payer: Self-pay | Admitting: Obstetrics & Gynecology

## 2013-10-14 ENCOUNTER — Ambulatory Visit (HOSPITAL_COMMUNITY)
Admission: RE | Admit: 2013-10-14 | Discharge: 2013-10-14 | Disposition: A | Discharge status: Home / Self Care | Payer: Medicaid Other | Source: Ambulatory Visit | Attending: Obstetrics & Gynecology | Admitting: Obstetrics & Gynecology

## 2013-10-14 VITALS — BP 122/80 | HR 87 | Wt 169.6 lb

## 2013-10-14 DIAGNOSIS — E669 Obesity, unspecified: Secondary | ICD-10-CM

## 2013-10-14 DIAGNOSIS — O99210 Obesity complicating pregnancy, unspecified trimester: Secondary | ICD-10-CM

## 2013-10-14 DIAGNOSIS — Z3402 Encounter for supervision of normal first pregnancy, second trimester: Secondary | ICD-10-CM

## 2013-10-14 DIAGNOSIS — Z3689 Encounter for other specified antenatal screening: Secondary | ICD-10-CM | POA: Diagnosis present

## 2013-10-14 DIAGNOSIS — Z3400 Encounter for supervision of normal first pregnancy, unspecified trimester: Secondary | ICD-10-CM | POA: Insufficient documentation

## 2013-10-14 DIAGNOSIS — Z1389 Encounter for screening for other disorder: Secondary | ICD-10-CM

## 2013-10-14 LAB — POCT URINALYSIS DIP (DEVICE)
Bilirubin Urine: NEGATIVE
Glucose, UA: NEGATIVE mg/dL
Hgb urine dipstick: NEGATIVE
Ketones, ur: NEGATIVE mg/dL
Nitrite: NEGATIVE
Protein, ur: NEGATIVE mg/dL
Specific Gravity, Urine: 1.02 (ref 1.005–1.030)
Urobilinogen, UA: 1 mg/dL (ref 0.0–1.0)
pH: 6.5 (ref 5.0–8.0)

## 2013-10-14 LAB — OB RESULTS CONSOLE RUBELLA ANTIBODY, IGM: RUBELLA: NON-IMMUNE/NOT IMMUNE

## 2013-10-14 NOTE — Progress Notes (Signed)
Patient reports moderate lower pelvic pressure. Takes tylenol to relieve the pain.  Scheduled for future ultrasound LMP was some time in June (p/t can't remember exact date) Educate patient on non-estrogen birth cont  (scared of breast cancer)

## 2013-10-14 NOTE — Progress Notes (Signed)
   Subjective: late to care    Kayla Reilly is a G1P0000 [id number] being seen today for her first obstetrical visit.  Her obstetrical history is significant for late to care. Patient does intend to breast feed. Pregnancy history fully reviewed.  Patient reports no complaints.  Filed Vitals:   10/14/13 0843  BP: 122/80  Pulse: 87  Weight: 169 lb 9.6 oz (76.93 kg)    HISTORY: OB History  Gravida Para Term Preterm AB SAB TAB Ectopic Multiple Living  1 0 0 0 0 0 0 0 0 0     # Outcome Date GA Lbr Len/2nd Weight Sex Delivery Anes PTL Lv  1 CUR              Past Medical History  Diagnosis Date  . Acne vulgaris   . Abnormal Pap smear     prior   Past Surgical History  Procedure Laterality Date  . Root canal    . Colposcopy     Family History  Problem Relation Age of Onset  . Hypertension Mother   . Diabetes Mother   . Cancer Maternal Grandmother     cancer     Exam    Uterus:     Pelvic Exam:    Perineum: No Hemorrhoids   Vulva: normal   Vagina:  normal mucosa   pH:     Cervix: no lesions   Adnexa: normal adnexa   Bony Pelvis: average  System: Breast:  normal appearance, no masses or tenderness   Skin: normal coloration and turgor, no rashes    Neurologic: oriented, normal mood   Extremities: normal strength, tone, and muscle mass   HEENT sclera clear, anicteric   Mouth/Teeth dental hygiene good   Neck supple   Cardiovascular: regular rate and rhythm   Respiratory:  appears well, vitals normal, no respiratory distress, acyanotic, normal RR   Abdomen: soft, non-tender; bowel sounds normal; no masses,  no organomegaly   Urinary: urethral meatus normal      Assessment:    Pregnancy: G1P0000 Patient Active Problem List   Diagnosis Date Noted  . Supervision of low-risk first pregnancy 10/14/2013  . Obesity, unspecified 01/01/2013  . Acne 04/15/2011  . Screen for STD (sexually transmitted disease) 04/15/2011  . Routine general medical examination at a  health care facility 04/15/2011  . HPV vaccine counseling 04/15/2011  . History of abnormal Pap smear 04/15/2011        Plan:     Initial labs drawn. Prenatal vitamins. Problem list reviewed and updated. Genetic Screening too late.  Ultrasound discussed; fetal survey: ordered.  Follow up in 4 weeks. 50% of 30 min visit spent on counseling and coordination of care.     Mabry Santarelli 10/14/2013

## 2013-10-14 NOTE — Patient Instructions (Signed)
Prenatal Care  WHAT IS PRENATAL CARE?  Prenatal care means health care during your pregnancy, before your baby is born. It is very important to take care of yourself and your baby during your pregnancy by:   Getting early prenatal care. If you know you are pregnant, or think you might be pregnant, call your health care provider as soon as possible. Schedule a visit for a prenatal exam.  Getting regular prenatal care. Follow your health care provider's schedule for blood and other necessary tests. Do not miss appointments.  Doing everything you can to keep yourself and your baby healthy during your pregnancy.  Getting complete care. Prenatal care should include evaluation of the medical, dietary, educational, psychological, and social needs of you and your significant other. The medical and genetic history of your family and the family of your baby's father should be discussed with your health care provider.  Discussing with your health care provider:  Prescription, over-the-counter, and herbal medicines that you take.  Any history of substance abuse, alcohol use, smoking, and illegal drug use.  Any history of domestic abuse and violence.  Immunizations you have received.  Your nutrition and diet.  The amount of exercise you do.  Any environmental and occupational hazards to which you are exposed.  History of sexually transmitted infections for both you and your partner.  Previous pregnancies you have had. WHY IS PRENATAL CARE SO IMPORTANT?  By regularly seeing your health care provider, you help ensure that problems can be identified early so that they can be treated as soon as possible. Other problems might be prevented. Many studies have shown that early and regular prenatal care is important for the health of mothers and their babies.  HOW CAN I TAKE CARE OF MYSELF WHILE I AM PREGNANT?  Here are ways to take care of yourself and your baby:   Start or continue taking your  multivitamin with 400 micrograms (mcg) of folic acid every day.  Get early and regular prenatal care. It is very important to see a health care provider during your pregnancy. Your health care provider will check at each visit to make sure that you and your baby are healthy. If there are any problems, action can be taken right away to help you and your baby.  Eat a healthy diet that includes:  Fruits.  Vegetables.  Foods low in saturated fat.  Whole grains.  Calcium-rich foods, such as milk, yogurt, and hard cheeses.  Drink 6-8 glasses of liquids a day.  Unless your health care provider tells you not to, try to be physically active for 30 minutes, most days of the week. If you are pressed for time, you can get your activity in through 10-minute segments, three times a day.  Do not smoke, drink alcohol, or use drugs. These can cause long-term damage to your baby. Talk with your health care provider about steps to take to stop smoking. Talk with a member of your faith community, a counselor, a trusted friend, or your health care provider if you are concerned about your alcohol or drug use.  Ask your health care provider before taking any medicine, even over-the-counter medicines. Some medicines are not safe to take during pregnancy.  Get plenty of rest and sleep.  Avoid hot tubs and saunas during pregnancy.  Do not have X-rays taken unless absolutely necessary and with the recommendation of your health care provider. A lead shield can be placed on your abdomen to protect your baby when   X-rays are taken in other parts of your body.  Do not empty the cat litter when you are pregnant. It may contain a parasite that causes an infection called toxoplasmosis, which can cause birth defects. Also, use gloves when working in garden areas used by cats.  Do not eat uncooked or undercooked meats or fish.  Do not eat soft, mold-ripened cheeses (Brie, Camembert, and chevre) or soft, blue-veined  cheese (Danish blue and Roquefort).  Stay away from toxic chemicals like:  Insecticides.  Solvents (some cleaners or paint thinners).  Lead.  Mercury.  Sexual intercourse may continue until the end of the pregnancy, unless you have a medical problem or there is a problem with the pregnancy and your health care provider tells you not to.  Do not wear high-heel shoes, especially during the second half of the pregnancy. You can lose your balance and fall.  Do not take long trips, unless absolutely necessary. Be sure to see your health care provider before going on the trip.  Do not sit in one position for more than 2 hours when on a trip.  Take a copy of your medical records when going on a trip. Know where a hospital is located in the city you are visiting, in case of an emergency.  Most dangerous household products will have pregnancy warnings on their labels. Ask your health care provider about products if you are unsure.  Limit or eliminate your caffeine intake from coffee, tea, sodas, medicines, and chocolate.  Many women continue working through pregnancy. Staying active might help you stay healthier. If you have a question about the safety or the hours you work at your particular job, talk with your health care provider.  Get informed:  Read books.  Watch videos.  Go to childbirth classes for you and your significant other.  Talk with experienced moms.  Ask your health care provider about childbirth education classes for you and your partner. Classes can help you and your partner prepare for the birth of your baby.  Ask about a baby doctor (pediatrician) and methods and pain medicine for labor, delivery, and possible cesarean delivery. HOW OFTEN SHOULD I SEE MY HEALTH CARE PROVIDER DURING PREGNANCY?  Your health care provider will give you a schedule for your prenatal visits. You will have visits more often as you get closer to the end of your pregnancy. An average  pregnancy lasts about 40 weeks.  A typical schedule includes visiting your health care provider:   About once each month during your first 6 months of pregnancy.  Every 2 weeks during the next 2 months.  Weekly in the last month, until the delivery date. Your health care provider will probably want to see you more often if:  You are older than 35 years.  Your pregnancy is high risk because you have certain health problems or problems with the pregnancy, such as:  Diabetes.  High blood pressure.  The baby is not growing on schedule, according to the dates of the pregnancy. Your health care provider will do special tests to make sure you and your baby are not having any serious problems. WHAT HAPPENS DURING PRENATAL VISITS?   At your first prenatal visit, your health care provider will do a physical exam and talk to you about your health history and the health history of your partner and your family. Your health care provider will be able to tell you what date to expect your baby to be born on.  Your   first physical exam will include checks of your blood pressure, measurements of your height and weight, and an exam of your pelvic organs. Your health care provider will do a Pap test if you have not had one recently and will do cultures of your cervix to make sure there is no infection.  At each prenatal visit, there will be tests of your blood, urine, blood pressure, weight, and the progress of the baby will be checked.  At your later prenatal visits, your health care provider will check how you are doing and how your baby is developing. You may have a number of tests done as your pregnancy progresses.  Ultrasound exams are often used to check on your baby's growth and health.  You may have more urine and blood tests, as well as special tests, if needed. These may include amniocentesis to examine fluid in the pregnancy sac, stress tests to check how the baby responds to contractions, or a  biophysical profile to measure your baby's well-being. Your health care provider will explain the tests and why they are necessary.  You should be tested for high blood sugar (gestational diabetes) between the 24th and 28th weeks of your pregnancy.  You should discuss with your health care provider your plans to breastfeed or bottle-feed your baby.  Each visit is also a chance for you to learn about staying healthy during pregnancy and to ask questions. Document Released: 02/21/2003 Document Revised: 02/23/2013 Document Reviewed: 05/05/2013 [employer] Patient Information 2015 [employer]. This information is not intended to replace advice given to you by your health care provider. Make sure you discuss any questions you have with your health care provider.  

## 2013-10-14 NOTE — Progress Notes (Signed)
U/S scheduled for 10/14/13 @ 1pm.

## 2013-10-14 NOTE — Progress Notes (Signed)
Patient reports occasional hip tingling/pressure

## 2013-10-14 NOTE — Progress Notes (Signed)
Nutrition note: 1st visit consult Pt has gained 14.6# @ 7052w3d, which is > expected. Pt reports eating 3 meals & 1-3 snacks/d. Pt is taking a PNV. Pt reports no N/V or heartburn. NKFA.  Pt received verbal & written education on general nutrition during pregnancy. Discussed wt gain goals of 15-25# or 0.6#/wk. Pt agrees to continue taking PNV & monitor her portion sizes. Pt has WIC & plans to BF. F/u as needed Blondell RevealLaura Noya Santarelli, MS, RD, LDN, [facility] Center[facility]CLC

## 2013-10-15 LAB — HIV ANTIBODY (ROUTINE TESTING W REFLEX): HIV: NONREACTIVE

## 2013-10-15 LAB — GLUCOSE TOLERANCE, 1 HOUR (50G) W/O FASTING: GLUCOSE 1 HOUR GTT: 82 mg/dL (ref 70–140)

## 2013-10-15 LAB — HEPATITIS B SURFACE ANTIBODY,QUALITATIVE: Hep B S Ab: POSITIVE — AB

## 2013-10-15 LAB — RPR

## 2013-10-16 LAB — PRESCRIPTION MONITORING PROFILE (19 PANEL)
AMPHETAMINE/METH: NEGATIVE ng/mL
BUPRENORPHINE, URINE: NEGATIVE ng/mL
Barbiturate Screen, Urine: NEGATIVE ng/mL
Benzodiazepine Screen, Urine: NEGATIVE ng/mL
CANNABINOID SCRN UR: NEGATIVE ng/mL
CARISOPRODOL, URINE: NEGATIVE ng/mL
COCAINE METABOLITES: NEGATIVE ng/mL
CREATININE, URINE: 182.78 mg/dL (ref >20.0)
FENTANYL URINE: NEGATIVE ng/mL
MDMA URINE: NEGATIVE ng/mL
METHAQUALONE SCREEN (URINE): NEGATIVE ng/mL
Meperidine, Ur: NEGATIVE ng/mL
Methadone Screen, Urine: NEGATIVE ng/mL
Nitrites, Initial: NEGATIVE ug/mL
OXYCODONE SCRN UR: NEGATIVE ng/mL
Opiate Screen, Urine: NEGATIVE ng/mL
PHENCYCLIDINE, UR: NEGATIVE ng/mL
Propoxyphene: NEGATIVE ng/mL
TAPENTADOLUR: NEGATIVE ng/mL
Tramadol Scrn, Ur: NEGATIVE ng/mL
Zolpidem, Urine: NEGATIVE ng/mL
pH, Initial: 6.4 pH (ref 4.5–8.9)

## 2013-10-16 LAB — CULTURE, OB URINE
Colony Count: NO GROWTH
ORGANISM ID, BACTERIA: NO GROWTH

## 2013-10-18 LAB — HEMOGLOBINOPATHY EVALUATION
HEMOGLOBIN OTHER: 0 %
HGB F QUANT: 0.6 % (ref 0.0–2.0)
HGB S QUANTITAION: 0 %
Hgb A2 Quant: 2.9 % (ref 2.2–3.2)
Hgb A: 96.5 % — ABNORMAL LOW (ref 96.8–97.8)

## 2013-10-19 ENCOUNTER — Telehealth: Payer: Self-pay

## 2013-10-19 ENCOUNTER — Other Ambulatory Visit: Payer: BC Managed Care – PPO

## 2013-10-19 DIAGNOSIS — Z3402 Encounter for supervision of normal first pregnancy, second trimester: Secondary | ICD-10-CM

## 2013-10-19 NOTE — Telephone Encounter (Signed)
Message copied by Louanna RawAMPBELL, Stefhanie Kachmar M on Tue Oct 19, 2013 10:56 AM ------      Message from: Adam PhenixARNOLD, JAMES G      Created: Tue Oct 19, 2013 10:38 AM       HBSAG pos, needs hepatitis panel ------

## 2013-10-19 NOTE — Progress Notes (Unsigned)
Consulted Dr. Debroah LoopArnold regarding specific labs needed to be drawn-- after reviewing chart noted Hep B ANTIBODY was positive and not antigen. Requested hep b antigen be tested only and if negative no other labs need to be run.

## 2013-10-19 NOTE — Telephone Encounter (Signed)
Called patient and informed of result and the need to come in for further blood work. Patient states she can come in this afternoon at 1330. Informed patient I would put her on the schedule. Patient questioned how she may have gotten it-- informed patient you can be exposed to hep B through unprotected sex or sharing toothbrushes, razors, needles etc. Patient verbalized understanding. No further questions or concerns.

## 2013-10-20 LAB — HEPATITIS B SURFACE ANTIGEN: Hepatitis B Surface Ag: NEGATIVE

## 2013-10-21 ENCOUNTER — Telehealth: Payer: Self-pay | Admitting: *Deleted

## 2013-10-21 LAB — RUBELLA ANTIBODY, IGM: Rubella IgM: 0.35

## 2013-10-21 NOTE — Telephone Encounter (Signed)
Message copied by Dorothyann PengHAIZLIP, Halina Asano E on Thu Oct 21, 2013  1:21 PM ------      Message from: Adam PhenixARNOLD, JAMES G      Created: Thu Oct 21, 2013 11:57 AM       Negative for hep B infection ------

## 2013-10-21 NOTE — Telephone Encounter (Signed)
Contacted patient and gave results.  Pt verbalizes understanding.

## 2013-11-12 ENCOUNTER — Encounter: Payer: BC Managed Care – PPO | Admitting: Obstetrics and Gynecology

## 2013-11-15 ENCOUNTER — Ambulatory Visit (INDEPENDENT_AMBULATORY_CARE_PROVIDER_SITE_OTHER): Payer: Medicaid Other | Admitting: Obstetrics & Gynecology

## 2013-11-15 ENCOUNTER — Other Ambulatory Visit: Payer: Self-pay | Admitting: Obstetrics & Gynecology

## 2013-11-15 ENCOUNTER — Encounter: Payer: Self-pay | Admitting: Obstetrics & Gynecology

## 2013-11-15 VITALS — BP 115/66 | HR 94 | Wt 177.4 lb

## 2013-11-15 DIAGNOSIS — Z3400 Encounter for supervision of normal first pregnancy, unspecified trimester: Secondary | ICD-10-CM

## 2013-11-15 DIAGNOSIS — Z23 Encounter for immunization: Secondary | ICD-10-CM

## 2013-11-15 DIAGNOSIS — Z3402 Encounter for supervision of normal first pregnancy, second trimester: Secondary | ICD-10-CM

## 2013-11-15 LAB — POCT URINALYSIS DIP (DEVICE)
BILIRUBIN URINE: NEGATIVE
Glucose, UA: NEGATIVE mg/dL
HGB URINE DIPSTICK: NEGATIVE
Ketones, ur: NEGATIVE mg/dL
Nitrite: NEGATIVE
PH: 5.5 (ref 5.0–8.0)
PROTEIN: NEGATIVE mg/dL
Specific Gravity, Urine: 1.01 (ref 1.005–1.030)
Urobilinogen, UA: 0.2 mg/dL (ref 0.0–1.0)

## 2013-11-15 LAB — CBC
HCT: 33 % — ABNORMAL LOW (ref 36.0–46.0)
HEMOGLOBIN: 11.6 g/dL — AB (ref 12.0–15.0)
MCH: 30.4 pg (ref 26.0–34.0)
MCHC: 35.2 g/dL (ref 30.0–36.0)
MCV: 86.4 fL (ref 78.0–100.0)
Platelets: 253 10*3/uL (ref 150–400)
RBC: 3.82 MIL/uL — ABNORMAL LOW (ref 3.87–5.11)
RDW: 13.5 % (ref 11.5–15.5)
WBC: 9.1 10*3/uL (ref 4.0–10.5)

## 2013-11-15 MED ORDER — TETANUS-DIPHTH-ACELL PERTUSSIS 5-2.5-18.5 LF-MCG/0.5 IM SUSP: 0.5000 mL | Freq: Once | INTRAMUSCULAR | Status: DC

## 2013-11-15 NOTE — Patient Instructions (Addendum)
Return to clinic for any obstetric concerns or go to [facility] for evaluation  

## 2013-11-15 NOTE — Progress Notes (Signed)
Ordered for CBC and antibody screen today Flu and Tdap vaccines today. 1 hr GTT 82 No other complaints or concerns.  Labor and fetal movement precautions reviewed.

## 2013-11-16 LAB — ANTIBODY SCREEN: Antibody Screen: NEGATIVE

## 2013-12-07 ENCOUNTER — Ambulatory Visit (INDEPENDENT_AMBULATORY_CARE_PROVIDER_SITE_OTHER): Payer: Medicaid Other | Admitting: Advanced Practice Midwife

## 2013-12-07 VITALS — BP 124/67 | HR 97 | Temp 98.3°F | Wt 179.7 lb

## 2013-12-07 DIAGNOSIS — Z3402 Encounter for supervision of normal first pregnancy, second trimester: Secondary | ICD-10-CM

## 2013-12-07 LAB — CBC
HEMATOCRIT: 34.3 % — AB (ref 36.0–46.0)
HEMOGLOBIN: 11.8 g/dL — AB (ref 12.0–15.0)
MCH: 30.3 pg (ref 26.0–34.0)
MCHC: 34.4 g/dL (ref 30.0–36.0)
MCV: 88.2 fL (ref 78.0–100.0)
Platelets: 228 10*3/uL (ref 150–400)
RBC: 3.89 MIL/uL (ref 3.87–5.11)
RDW: 13.5 % (ref 11.5–15.5)
WBC: 7.8 10*3/uL (ref 4.0–10.5)

## 2013-12-07 LAB — POCT URINALYSIS DIP (DEVICE)
Bilirubin Urine: NEGATIVE
Glucose, UA: NEGATIVE mg/dL
Hgb urine dipstick: NEGATIVE
KETONES UR: NEGATIVE mg/dL
Nitrite: NEGATIVE
PH: 7 (ref 5.0–8.0)
Protein, ur: NEGATIVE mg/dL
Specific Gravity, Urine: 1.01 (ref 1.005–1.030)
Urobilinogen, UA: 0.2 mg/dL (ref 0.0–1.0)

## 2013-12-07 NOTE — Progress Notes (Signed)
28 week labs. Active baby.

## 2013-12-07 NOTE — Patient Instructions (Addendum)
[email or web address] Prenatal classes  Preterm Labor Information Preterm labor is when labor starts at less than 37 weeks of pregnancy. The normal length of a pregnancy is 39 to 41 weeks. CAUSES Often, there is no identifiable underlying cause as to why a woman goes into preterm labor. One of the most common known causes of preterm labor is infection. Infections of the uterus, cervix, vagina, amniotic sac, bladder, kidney, or even the lungs (pneumonia) can cause labor to start. Other suspected causes of preterm labor include:   Urogenital infections, such as yeast infections and bacterial vaginosis.   Uterine abnormalities (uterine shape, uterine septum, fibroids, or bleeding from the placenta).   A cervix that has been operated on (it may fail to stay closed).   Malformations in the fetus.   Multiple gestations (twins, triplets, and so on).   Breakage of the amniotic sac.  RISK FACTORS  Having a previous history of preterm labor.   Having premature rupture of membranes (PROM).   Having a placenta that covers the opening of the cervix (placenta previa).   Having a placenta that separates from the uterus (placental abruption).   Having a cervix that is too weak to hold the fetus in the uterus (incompetent cervix).   Having too much fluid in the amniotic sac (polyhydramnios).   Taking illegal drugs or smoking while pregnant.   Not gaining enough weight while pregnant.   Being younger than 6418 and older than 26 years old.   Having a low socioeconomic status.   Being African American. SYMPTOMS Signs and symptoms of preterm labor include:   Menstrual-like cramps, abdominal pain, or back pain.  Uterine contractions that are regular, as frequent as six in an hour, regardless of their intensity (may be mild or painful).  Contractions that start on the top of the uterus and spread down to the lower abdomen and back.   A sense of increased pelvic pressure.   A  watery or bloody mucus discharge that comes from the vagina.  TREATMENT Depending on the length of the pregnancy and other circumstances, your health care provider may suggest bed rest. If necessary, there are medicines that can be given to stop contractions and to mature the fetal lungs. If labor happens before 34 weeks of pregnancy, a prolonged hospital stay may be recommended. Treatment depends on the condition of both you and the fetus.  WHAT SHOULD YOU DO IF YOU THINK YOU ARE IN PRETERM LABOR? Call your health care provider right away. You will need to go to the hospital to get checked immediately. HOW CAN YOU PREVENT PRETERM LABOR IN FUTURE PREGNANCIES? You should:   Stop smoking if you smoke.  Maintain healthy weight gain and avoid chemicals and drugs that are not necessary.  Be watchful for any type of infection.  Inform your health care provider if you have a known history of preterm labor. Document Released: 05/11/2003 Document Revised: 10/21/2012 Document Reviewed: 03/23/2012 [facility] Patient Information 2015 [employer] [place]. This information is not intended to replace advice given to you by your health care provider. Make sure you discuss any questions you have with your health care provider.  Third Trimester of Pregnancy The third trimester is from week 29 through week 42, months 7 through 9. The third trimester is a time when the fetus is growing rapidly. At the end of the ninth month, the fetus is about 20 inches in length and weighs 6-10 pounds.  BODY CHANGES Your body goes through many changes during pregnancy.  The changes vary from woman to woman.   Your weight will continue to increase. You can expect to gain 25-35 pounds (11-16 kg) by the end of the pregnancy.  You may begin to get stretch marks on your hips, abdomen, and breasts.  You may urinate more often because the fetus is moving lower into your pelvis and pressing on your bladder.  You may develop or continue  to have heartburn as a result of your pregnancy.  You may develop constipation because certain hormones are causing the muscles that push waste through your intestines to slow down.  You may develop hemorrhoids or swollen, bulging veins (varicose veins).  You may have pelvic pain because of the weight gain and pregnancy hormones relaxing your joints between the bones in your pelvis. Backaches may result from overexertion of the muscles supporting your posture.  You may have changes in your hair. These can include thickening of your hair, rapid growth, and changes in texture. Some women also have hair loss during or after pregnancy, or hair that feels dry or thin. Your hair will most likely return to normal after your baby is born.  Your breasts will continue to grow and be tender. A yellow discharge may leak from your breasts called colostrum.  Your belly button may stick out.  You may feel short of breath because of your expanding uterus.  You may notice the fetus "dropping," or moving lower in your abdomen.  You may have a bloody mucus discharge. This usually occurs a few days to a week before labor begins.  Your cervix becomes thin and soft (effaced) near your due date. WHAT TO EXPECT AT YOUR PRENATAL EXAMS  You will have prenatal exams every 2 weeks until week 36. Then, you will have weekly prenatal exams. During a routine prenatal visit:  You will be weighed to make sure you and the fetus are growing normally.  Your blood pressure is taken.  Your abdomen will be measured to track your baby's growth.  The fetal heartbeat will be listened to.  Any test results from the previous visit will be discussed.  You may have a cervical check near your due date to see if you have effaced. At around 36 weeks, your caregiver will check your cervix. At the same time, your caregiver will also perform a test on the secretions of the vaginal tissue. This test is to determine if a type of  bacteria, Group B streptococcus, is present. Your caregiver will explain this further. Your caregiver may ask you:  What your birth plan is.  How you are feeling.  If you are feeling the baby move.  If you have had any abnormal symptoms, such as leaking fluid, bleeding, severe headaches, or abdominal cramping.  If you have any questions. Other tests or screenings that may be performed during your third trimester include:  Blood tests that check for low iron levels (anemia).  Fetal testing to check the health, activity level, and growth of the fetus. Testing is done if you have certain medical conditions or if there are problems during the pregnancy. FALSE LABOR You may feel small, irregular contractions that eventually go away. These are called Braxton Hicks contractions, or false labor. Contractions may last for hours, days, or even weeks before true labor sets in. If contractions come at regular intervals, intensify, or become painful, it is best to be seen by your caregiver.  SIGNS OF LABOR   Menstrual-like cramps.  Contractions that are 5 minutes  apart or less.  Contractions that start on the top of the uterus and spread down to the lower abdomen and back.  A sense of increased pelvic pressure or back pain.  A watery or bloody mucus discharge that comes from the vagina. If you have any of these signs before the 37th week of pregnancy, call your caregiver right away. You need to go to the hospital to get checked immediately. HOME CARE INSTRUCTIONS   Avoid all smoking, herbs, alcohol, and unprescribed drugs. These chemicals affect the formation and growth of the baby.  Follow your caregiver's instructions regarding medicine use. There are medicines that are either safe or unsafe to take during pregnancy.  Exercise only as directed by your caregiver. Experiencing uterine cramps is a good sign to stop exercising.  Continue to eat regular, healthy meals.  Wear a good support  bra for breast tenderness.  Do not use hot tubs, steam rooms, or saunas.  Wear your seat belt at all times when driving.  Avoid raw meat, uncooked cheese, cat litter boxes, and soil used by cats. These carry germs that can cause birth defects in the baby.  Take your prenatal vitamins.  Try taking a stool softener (if your caregiver approves) if you develop constipation. Eat more high-fiber foods, such as fresh vegetables or fruit and whole grains. Drink plenty of fluids to keep your urine clear or pale yellow.  Take warm sitz baths to soothe any pain or discomfort caused by hemorrhoids. Use hemorrhoid cream if your caregiver approves.  If you develop varicose veins, wear support hose. Elevate your feet for 15 minutes, 3-4 times a day. Limit salt in your diet.  Avoid heavy lifting, wear low heal shoes, and practice good posture.  Rest a lot with your legs elevated if you have leg cramps or low back pain.  Visit your dentist if you have not gone during your pregnancy. Use a soft toothbrush to brush your teeth and be gentle when you floss.  A sexual relationship may be continued unless your caregiver directs you otherwise.  Do not travel far distances unless it is absolutely necessary and only with the approval of your caregiver.  Take prenatal classes to understand, practice, and ask questions about the labor and delivery.  Make a trial run to the hospital.  Pack your hospital bag.  Prepare the baby's nursery.  Continue to go to all your prenatal visits as directed by your caregiver. SEEK MEDICAL CARE IF:  You are unsure if you are in labor or if your water has broken.  You have dizziness.  You have mild pelvic cramps, pelvic pressure, or nagging pain in your abdominal area.  You have persistent nausea, vomiting, or diarrhea.  You have a bad smelling vaginal discharge.  You have pain with urination. SEEK IMMEDIATE MEDICAL CARE IF:   You have a fever.  You are leaking  fluid from your vagina.  You have spotting or bleeding from your vagina.  You have severe abdominal cramping or pain.  You have rapid weight loss or gain.  You have shortness of breath with chest pain.  You notice sudden or extreme swelling of your face, hands, ankles, feet, or legs.  You have not felt your baby move in over an hour.  You have severe headaches that do not go away with medicine.  You have vision changes. Document Released: 02/12/2001 Document Revised: 02/23/2013 Document Reviewed: 04/21/2012 [facility] Patient Information 2015 [place], [place]. This information is not intended to replace  advice given to you by your health care provider. Make sure you discuss any questions you have with your health care provider.  

## 2013-12-07 NOTE — Progress Notes (Signed)
1hr gtt and labs today.  Tdap and flu done at last visit.

## 2013-12-08 ENCOUNTER — Encounter: Payer: Self-pay | Admitting: Advanced Practice Midwife

## 2013-12-08 LAB — RPR

## 2013-12-08 LAB — HIV ANTIBODY (ROUTINE TESTING W REFLEX): HIV 1&2 Ab, 4th Generation: NONREACTIVE

## 2013-12-08 LAB — GLUCOSE TOLERANCE, 1 HOUR (50G) W/O FASTING: GLUCOSE 1 HOUR GTT: 121 mg/dL (ref 70–140)

## 2013-12-22 ENCOUNTER — Ambulatory Visit (INDEPENDENT_AMBULATORY_CARE_PROVIDER_SITE_OTHER): Payer: Medicaid Other | Admitting: Advanced Practice Midwife

## 2013-12-22 ENCOUNTER — Encounter: Payer: Self-pay | Admitting: Advanced Practice Midwife

## 2013-12-22 VITALS — BP 112/71 | HR 92 | Temp 98.0°F | Wt 181.4 lb

## 2013-12-22 DIAGNOSIS — Z113 Encounter for screening for infections with a predominantly sexual mode of transmission: Secondary | ICD-10-CM

## 2013-12-22 DIAGNOSIS — Z3490 Encounter for supervision of normal pregnancy, unspecified, unspecified trimester: Secondary | ICD-10-CM

## 2013-12-22 LAB — POCT URINALYSIS DIP (DEVICE)
Bilirubin Urine: NEGATIVE
GLUCOSE, UA: NEGATIVE mg/dL
HGB URINE DIPSTICK: NEGATIVE
Ketones, ur: NEGATIVE mg/dL
Nitrite: NEGATIVE
Protein, ur: NEGATIVE mg/dL
Urobilinogen, UA: 1 mg/dL (ref 0.0–1.0)
pH: 6 (ref 5.0–8.0)

## 2013-12-22 NOTE — Patient Instructions (Signed)
Third Trimester of Pregnancy The third trimester is from week 29 through week 42, months 7 through 9. The third trimester is a time when the fetus is growing rapidly. At the end of the ninth month, the fetus is about 20 inches in length and weighs 6-10 pounds.  BODY CHANGES Your body goes through many changes during pregnancy. The changes vary from woman to woman.   Your weight will continue to increase. You can expect to gain 25-35 pounds (11-16 kg) by the end of the pregnancy.  You may begin to get stretch marks on your hips, abdomen, and breasts.  You may urinate more often because the fetus is moving lower into your pelvis and pressing on your bladder.  You may develop or continue to have heartburn as a result of your pregnancy.  You may develop constipation because certain hormones are causing the muscles that push waste through your intestines to slow down.  You may develop hemorrhoids or swollen, bulging veins (varicose veins).  You may have pelvic pain because of the weight gain and pregnancy hormones relaxing your joints between the bones in your pelvis. Backaches may result from overexertion of the muscles supporting your posture.  You may have changes in your hair. These can include thickening of your hair, rapid growth, and changes in texture. Some women also have hair loss during or after pregnancy, or hair that feels dry or thin. Your hair will most likely return to normal after your baby is born.  Your breasts will continue to grow and be tender. A yellow discharge may leak from your breasts called colostrum.  Your belly button may stick out.  You may feel short of breath because of your expanding uterus.  You may notice the fetus "dropping," or moving lower in your abdomen.  You may have a bloody mucus discharge. This usually occurs a few days to a week before labor begins.  Your cervix becomes thin and soft (effaced) near your due date. WHAT TO EXPECT AT YOUR PRENATAL  EXAMS  You will have prenatal exams every 2 weeks until week 36. Then, you will have weekly prenatal exams. During a routine prenatal visit:  You will be weighed to make sure you and the fetus are growing normally.  Your blood pressure is taken.  Your abdomen will be measured to track your baby's growth.  The fetal heartbeat will be listened to.  Any test results from the previous visit will be discussed.  You may have a cervical check near your due date to see if you have effaced. At around 36 weeks, your caregiver will check your cervix. At the same time, your caregiver will also perform a test on the secretions of the vaginal tissue. This test is to determine if a type of bacteria, Group B streptococcus, is present. Your caregiver will explain this further. Your caregiver may ask you:  What your birth plan is.  How you are feeling.  If you are feeling the baby move.  If you have had any abnormal symptoms, such as leaking fluid, bleeding, severe headaches, or abdominal cramping.  If you have any questions. Other tests or screenings that may be performed during your third trimester include:  Blood tests that check for low iron levels (anemia).  Fetal testing to check the health, activity level, and growth of the fetus. Testing is done if you have certain medical conditions or if there are problems during the pregnancy. FALSE LABOR You may feel small, irregular contractions that   eventually go away. These are called Braxton Hicks contractions, or false labor. Contractions may last for hours, days, or even weeks before true labor sets in. If contractions come at regular intervals, intensify, or become painful, it is best to be seen by your caregiver.  SIGNS OF LABOR   Menstrual-like cramps.  Contractions that are 5 minutes apart or less.  Contractions that start on the top of the uterus and spread down to the lower abdomen and back.  A sense of increased pelvic pressure or back  pain.  A watery or bloody mucus discharge that comes from the vagina. If you have any of these signs before the 37th week of pregnancy, call your caregiver right away. You need to go to the hospital to get checked immediately. HOME CARE INSTRUCTIONS   Avoid all smoking, herbs, alcohol, and unprescribed drugs. These chemicals affect the formation and growth of the baby.  Follow your caregiver's instructions regarding medicine use. There are medicines that are either safe or unsafe to take during pregnancy.  Exercise only as directed by your caregiver. Experiencing uterine cramps is a good sign to stop exercising.  Continue to eat regular, healthy meals.  Wear a good support bra for breast tenderness.  Do not use hot tubs, steam rooms, or saunas.  Wear your seat belt at all times when driving.  Avoid raw meat, uncooked cheese, cat litter boxes, and soil used by cats. These carry germs that can cause birth defects in the baby.  Take your prenatal vitamins.  Try taking a stool softener (if your caregiver approves) if you develop constipation. Eat more high-fiber foods, such as fresh vegetables or fruit and whole grains. Drink plenty of fluids to keep your urine clear or pale yellow.  Take warm sitz baths to soothe any pain or discomfort caused by hemorrhoids. Use hemorrhoid cream if your caregiver approves.  If you develop varicose veins, wear support hose. Elevate your feet for 15 minutes, 3-4 times a day. Limit salt in your diet.  Avoid heavy lifting, wear low heal shoes, and practice good posture.  Rest a lot with your legs elevated if you have leg cramps or low back pain.  Visit your dentist if you have not gone during your pregnancy. Use a soft toothbrush to brush your teeth and be gentle when you floss.  A sexual relationship may be continued unless your caregiver directs you otherwise.  Do not travel far distances unless it is absolutely necessary and only with the approval  of your caregiver.  Take prenatal classes to understand, practice, and ask questions about the labor and delivery.  Make a trial run to the hospital.  Pack your hospital bag.  Prepare the baby's nursery.  Continue to go to all your prenatal visits as directed by your caregiver. SEEK MEDICAL CARE IF:  You are unsure if you are in labor or if your water has broken.  You have dizziness.  You have mild pelvic cramps, pelvic pressure, or nagging pain in your abdominal area.  You have persistent nausea, vomiting, or diarrhea.  You have a bad smelling vaginal discharge.  You have pain with urination. SEEK IMMEDIATE MEDICAL CARE IF:   You have a fever.  You are leaking fluid from your vagina.  You have spotting or bleeding from your vagina.  You have severe abdominal cramping or pain.  You have rapid weight loss or gain.  You have shortness of breath with chest pain.  You notice sudden or extreme swelling   of your face, hands, ankles, feet, or legs.  You have not felt your baby move in over an hour.  You have severe headaches that do not go away with medicine.  You have vision changes. Document Released: 02/12/2001 Document Revised: 02/23/2013 Document Reviewed: 04/21/2012 [employer] Patient Information 2015 [employer]. This information is not intended to replace advice given to you by your health care provider. Make sure you discuss any questions you have with your health care provider.  

## 2013-12-22 NOTE — Progress Notes (Signed)
Doing well. HbSAb was positive but HbSAg was negative. Per CDC, indicates either past immunization or past disease.

## 2014-01-03 ENCOUNTER — Encounter: Payer: Self-pay | Admitting: Advanced Practice Midwife

## 2014-01-05 ENCOUNTER — Ambulatory Visit (INDEPENDENT_AMBULATORY_CARE_PROVIDER_SITE_OTHER): Payer: Medicaid Other | Admitting: Advanced Practice Midwife

## 2014-01-05 VITALS — BP 115/63 | HR 109 | Temp 97.8°F | Wt 182.6 lb

## 2014-01-05 DIAGNOSIS — Z3402 Encounter for supervision of normal first pregnancy, second trimester: Secondary | ICD-10-CM

## 2014-01-05 LAB — POCT URINALYSIS DIP (DEVICE)
BILIRUBIN URINE: NEGATIVE
Glucose, UA: NEGATIVE mg/dL
Ketones, ur: NEGATIVE mg/dL
NITRITE: NEGATIVE
PROTEIN: NEGATIVE mg/dL
Specific Gravity, Urine: >=1.03 (ref 1.005–1.030)
Urobilinogen, UA: 1 mg/dL (ref 0.0–1.0)
pH: 6 (ref 5.0–8.0)

## 2014-01-05 NOTE — Patient Instructions (Addendum)
Fetal Movement Counts Patient Name: __________________________________________________ Patient Due Date: ____________________ Performing a fetal movement count is highly recommended in high-risk pregnancies, but it is good for every pregnant woman to do. Your health care provider may ask you to start counting fetal movements at 28 weeks of the pregnancy. Fetal movements often increase: After eating a full meal. After physical activity. After eating or drinking something sweet or cold. At rest. Pay attention to when you feel the baby is most active. This will help you notice a pattern of your baby's sleep and wake cycles and what factors contribute to an increase in fetal movement. It is important to perform a fetal movement count at the same time each day when your baby is normally most active.  HOW TO COUNT FETAL MOVEMENTS Find a quiet and comfortable area to sit or lie down on your left side. Lying on your left side provides the best blood and oxygen circulation to your baby. Write down the day and time on a sheet of paper or in a journal. Start counting kicks, flutters, swishes, rolls, or jabs in a 2-hour period. You should feel at least 10 movements within 2 hours. If you do not feel 10 movements in 2 hours, wait 2-3 hours and count again. Look for a change in the pattern or not enough counts in 2 hours. SEEK MEDICAL CARE IF: You feel less than 10 counts in 2 hours, tried twice. There is no movement in over an hour. The pattern is changing or taking longer each day to reach 10 counts in 2 hours. You feel the baby is not moving as he or she usually does. Date: ____________ Movements: ____________ Start time: ____________ Doreatha MartinFinish time: ____________  Date: ____________ Movements: ____________ Start time: ____________ Doreatha MartinFinish time: ____________ Date: ____________ Movements: ____________ Start time: ____________ Doreatha MartinFinish time: ____________ Date: ____________ Movements: ____________ Start time:  ____________ Doreatha MartinFinish time: ____________ Date: ____________ Movements: ____________ Start time: ____________ Doreatha MartinFinish time: ____________ Date: ____________ Movements: ____________ Start time: ____________ Doreatha MartinFinish time: ____________ Date: ____________ Movements: ____________ Start time: ____________ Doreatha MartinFinish time: ____________ Date: ____________ Movements: ____________ Start time: ____________ Doreatha MartinFinish time: ____________  Date: ____________ Movements: ____________ Start time: ____________ Doreatha MartinFinish time: ____________ Date: ____________ Movements: ____________ Start time: ____________ Doreatha MartinFinish time: ____________ Date: ____________ Movements: ____________ Start time: ____________ Doreatha MartinFinish time: ____________ Date: ____________ Movements: ____________ Start time: ____________ Doreatha MartinFinish time: ____________ Date: ____________ Movements: ____________ Start time: ____________ Doreatha MartinFinish time: ____________ Date: ____________ Movements: ____________ Start time: ____________ Doreatha MartinFinish time: ____________ Date: ____________ Movements: ____________ Start time: ____________ Doreatha MartinFinish time: ____________  Date: ____________ Movements: ____________ Start time: ____________ Doreatha MartinFinish time: ____________ Date: ____________ Movements: ____________ Start time: ____________ Doreatha MartinFinish time: ____________ Date: ____________ Movements: ____________ Start time: ____________ Doreatha MartinFinish time: ____________ Date: ____________ Movements: ____________ Start time: ____________ Doreatha MartinFinish time: ____________ Date: ____________ Movements: ____________ Start time: ____________ Doreatha MartinFinish time: ____________ Date: ____________ Movements: ____________ Start time: ____________ Doreatha MartinFinish time: ____________ Date: ____________ Movements: ____________ Start time: ____________ Doreatha MartinFinish time: ____________  Date: ____________ Movements: ____________ Start time: ____________ Doreatha MartinFinish time: ____________ Date: ____________ Movements: ____________ Start time: ____________ Doreatha MartinFinish time: ____________ Date:  ____________ Movements: ____________ Start time: ____________ Doreatha MartinFinish time: ____________ Date: ____________ Movements: ____________ Start time: ____________ Doreatha MartinFinish time: ____________ Date: ____________ Movements: ____________ Start time: ____________ Doreatha MartinFinish time: ____________ Date: ____________ Movements: ____________ Start time: ____________ Doreatha MartinFinish time: ____________ Date: ____________ Movements: ____________ Start time: ____________ Doreatha MartinFinish time: ____________  Date: ____________ Movements: ____________ Start time: ____________ Doreatha MartinFinish time: ____________ Date: ____________ Movements: ____________ Start time: ____________ Doreatha MartinFinish time: ____________  Date: ____________ Movements: ____________ Start time: ____________ Doreatha Martin time: ____________ Date: ____________ Movements: ____________ Start time: ____________ Doreatha Martin time: ____________ Date: ____________ Movements: ____________ Start time: ____________ Doreatha Martin time: ____________ Date: ____________ Movements: ____________ Start time: ____________ Doreatha Martin time: ____________ Date: ____________ Movements: ____________ Start time: ____________ Doreatha Martin time: ____________  Date: ____________ Movements: ____________ Start time: ____________ Doreatha Martin time: ____________ Date: ____________ Movements: ____________ Start time: ____________ Doreatha Martin time: ____________ Date: ____________ Movements: ____________ Start time: ____________ Doreatha Martin time: ____________ Date: ____________ Movements: ____________ Start time: ____________ Doreatha Martin time: ____________ Date: ____________ Movements: ____________ Start time: ____________ Doreatha Martin time: ____________ Date: ____________ Movements: ____________ Start time: ____________ Doreatha Martin time: ____________ Date: ____________ Movements: ____________ Start time: ____________ Doreatha Martin time: ____________  Date: ____________ Movements: ____________ Start time: ____________ Doreatha Martin time: ____________ Date: ____________ Movements: ____________ Start  time: ____________ Doreatha Martin time: ____________ Date: ____________ Movements: ____________ Start time: ____________ Doreatha Martin time: ____________ Date: ____________ Movements: ____________ Start time: ____________ Doreatha Martin time: ____________ Date: ____________ Movements: ____________ Start time: ____________ Doreatha Martin time: ____________ Date: ____________ Movements: ____________ Start time: ____________ Doreatha Martin time: ____________ Date: ____________ Movements: ____________ Start time: ____________ Doreatha Martin time: ____________  Date: ____________ Movements: ____________ Start time: ____________ Doreatha Martin time: ____________ Date: ____________ Movements: ____________ Start time: ____________ Doreatha Martin time: ____________ Date: ____________ Movements: ____________ Start time: ____________ Doreatha Martin time: ____________ Date: ____________ Movements: ____________ Start time: ____________ Doreatha Martin time: ____________ Date: ____________ Movements: ____________ Start time: ____________ Doreatha Martin time: ____________ Date: ____________ Movements: ____________ Start time: ____________ Doreatha Martin time: ____________ Document Released: 03/20/2006 Document Revised: 07/05/2013 Document Reviewed: 12/16/2011 [employer] Patient Information 2015 [employer]. This information is not intended to replace advice given to you by your health care provider. Make sure you discuss any questions you have with your health care provider.    Third Trimester of Pregnancy The third trimester is from week 29 through week 42, months 7 through 9. The third trimester is a time when the fetus is growing rapidly. At the end of the ninth month, the fetus is about 20 inches in length and weighs 6-10 pounds.  BODY CHANGES Your body goes through many changes during pregnancy. The changes vary from woman to woman.   Your weight will continue to increase. You can expect to gain 25-35 pounds (11-16 kg) by the end of the pregnancy.  You may begin to get stretch marks on your  hips, abdomen, and breasts.  You may urinate more often because the fetus is moving lower into your pelvis and pressing on your bladder.  You may develop or continue to have heartburn as a result of your pregnancy.  You may develop constipation because certain hormones are causing the muscles that push waste through your intestines to slow down.  You may develop hemorrhoids or swollen, bulging veins (varicose veins).  You may have pelvic pain because of the weight gain and pregnancy hormones relaxing your joints between the bones in your pelvis. Backaches may result from overexertion of the muscles supporting your posture.  You may have changes in your hair. These can include thickening of your hair, rapid growth, and changes in texture. Some women also have hair loss during or after pregnancy, or hair that feels dry or thin. Your hair will most likely return to normal after your baby is born.  Your breasts will continue to grow and be tender. A yellow discharge may leak from your breasts called colostrum.  Your belly button may stick out.  You may feel short of breath because of your expanding uterus.  You may  notice the fetus "dropping," or moving lower in your abdomen.  You may have a bloody mucus discharge. This usually occurs a few days to a week before labor begins.  Your cervix becomes thin and soft (effaced) near your due date. WHAT TO EXPECT AT YOUR PRENATAL EXAMS  You will have prenatal exams every 2 weeks until week 36. Then, you will have weekly prenatal exams. During a routine prenatal visit:  You will be weighed to make sure you and the fetus are growing normally.  Your blood pressure is taken.  Your abdomen will be measured to track your baby's growth.  The fetal heartbeat will be listened to.  Any test results from the previous visit will be discussed.  You may have a cervical check near your due date to see if you have effaced. At around 36 weeks, your  caregiver will check your cervix. At the same time, your caregiver will also perform a test on the secretions of the vaginal tissue. This test is to determine if a type of bacteria, Group B streptococcus, is present. Your caregiver will explain this further. Your caregiver may ask you:  What your birth plan is.  How you are feeling.  If you are feeling the baby move.  If you have had any abnormal symptoms, such as leaking fluid, bleeding, severe headaches, or abdominal cramping.  If you have any questions. Other tests or screenings that may be performed during your third trimester include:  Blood tests that check for low iron levels (anemia).  Fetal testing to check the health, activity level, and growth of the fetus. Testing is done if you have certain medical conditions or if there are problems during the pregnancy. FALSE LABOR You may feel small, irregular contractions that eventually go away. These are called Braxton Hicks contractions, or false labor. Contractions may last for hours, days, or even weeks before true labor sets in. If contractions come at regular intervals, intensify, or become painful, it is best to be seen by your caregiver.  SIGNS OF LABOR   Menstrual-like cramps.  Contractions that are 5 minutes apart or less.  Contractions that start on the top of the uterus and spread down to the lower abdomen and back.  A sense of increased pelvic pressure or back pain.  A watery or bloody mucus discharge that comes from the vagina. If you have any of these signs before the 37th week of pregnancy, call your caregiver right away. You need to go to the hospital to get checked immediately. HOME CARE INSTRUCTIONS   Avoid all smoking, herbs, alcohol, and unprescribed drugs. These chemicals affect the formation and growth of the baby.  Follow your caregiver's instructions regarding medicine use. There are medicines that are either safe or unsafe to take during  pregnancy.  Exercise only as directed by your caregiver. Experiencing uterine cramps is a good sign to stop exercising.  Continue to eat regular, healthy meals.  Wear a good support bra for breast tenderness.  Do not use hot tubs, steam rooms, or saunas.  Wear your seat belt at all times when driving.  Avoid raw meat, uncooked cheese, cat litter boxes, and soil used by cats. These carry germs that can cause birth defects in the baby.  Take your prenatal vitamins.  Try taking a stool softener (if your caregiver approves) if you develop constipation. Eat more high-fiber foods, such as fresh vegetables or fruit and whole grains. Drink plenty of fluids to keep your urine clear or  pale yellow.  Take warm sitz baths to soothe any pain or discomfort caused by hemorrhoids. Use hemorrhoid cream if your caregiver approves.  If you develop varicose veins, wear support hose. Elevate your feet for 15 minutes, 3-4 times a day. Limit salt in your diet.  Avoid heavy lifting, wear low heal shoes, and practice good posture.  Rest a lot with your legs elevated if you have leg cramps or low back pain.  Visit your dentist if you have not gone during your pregnancy. Use a soft toothbrush to brush your teeth and be gentle when you floss.  A sexual relationship may be continued unless your caregiver directs you otherwise.  Do not travel far distances unless it is absolutely necessary and only with the approval of your caregiver.  Take prenatal classes to understand, practice, and ask questions about the labor and delivery.  Make a trial run to the hospital.  Pack your hospital bag.  Prepare the baby's nursery.  Continue to go to all your prenatal visits as directed by your caregiver. SEEK MEDICAL CARE IF:  You are unsure if you are in labor or if your water has broken.  You have dizziness.  You have mild pelvic cramps, pelvic pressure, or nagging pain in your abdominal area.  You have  persistent nausea, vomiting, or diarrhea.  You have a bad smelling vaginal discharge.  You have pain with urination. SEEK IMMEDIATE MEDICAL CARE IF:   You have a fever.  You are leaking fluid from your vagina.  You have spotting or bleeding from your vagina.  You have severe abdominal cramping or pain.  You have rapid weight loss or gain.  You have shortness of breath with chest pain.  You notice sudden or extreme swelling of your face, hands, ankles, feet, or legs.  You have not felt your baby move in over an hour.  You have severe headaches that do not go away with medicine.  You have vision changes. Document Released: 02/12/2001 Document Revised: 02/23/2013 Document Reviewed: 04/21/2012 [facility] Patient Information 2015 [place], [place]. This information is not intended to replace advice given to you by your health care provider. Make sure you discuss any questions you have with your health care provider.    Preterm Labor Information Preterm labor is when labor starts at less than 37 weeks of pregnancy. The normal length of a pregnancy is 39 to 41 weeks. CAUSES Often, there is no identifiable underlying cause as to why a woman goes into preterm labor. One of the most common known causes of preterm labor is infection. Infections of the uterus, cervix, vagina, amniotic sac, bladder, kidney, or even the lungs (pneumonia) can cause labor to start. Other suspected causes of preterm labor include:   Urogenital infections, such as yeast infections and bacterial vaginosis.   Uterine abnormalities (uterine shape, uterine septum, fibroids, or bleeding from the placenta).   A cervix that has been operated on (it may fail to stay closed).   Malformations in the fetus.   Multiple gestations (twins, triplets, and so on).   Breakage of the amniotic sac.  RISK FACTORS  Having a previous history of preterm labor.   Having premature rupture of membranes (PROM).   Having a  placenta that covers the opening of the cervix (placenta previa).   Having a placenta that separates from the uterus (placental abruption).   Having a cervix that is too weak to hold the fetus in the uterus (incompetent cervix).   Having too much  fluid in the amniotic sac (polyhydramnios).   Taking illegal drugs or smoking while pregnant.   Not gaining enough weight while pregnant.   Being younger than 3718 and older than 26 years old.   Having a low socioeconomic status.   Being African American. SYMPTOMS Signs and symptoms of preterm labor include:   Menstrual-like cramps, abdominal pain, or back pain.  Uterine contractions that are regular, as frequent as six in an hour, regardless of their intensity (may be mild or painful).  Contractions that start on the top of the uterus and spread down to the lower abdomen and back.   A sense of increased pelvic pressure.   A watery or bloody mucus discharge that comes from the vagina.  TREATMENT Depending on the length of the pregnancy and other circumstances, your health care provider may suggest bed rest. If necessary, there are medicines that can be given to stop contractions and to mature the fetal lungs. If labor happens before 34 weeks of pregnancy, a prolonged hospital stay may be recommended. Treatment depends on the condition of both you and the fetus.  WHAT SHOULD YOU DO IF YOU THINK YOU ARE IN PRETERM LABOR? Call your health care provider right away. You will need to go to the hospital to get checked immediately. HOW CAN YOU PREVENT PRETERM LABOR IN FUTURE PREGNANCIES? You should:   Stop smoking if you smoke.  Maintain healthy weight gain and avoid chemicals and drugs that are not necessary.  Be watchful for any type of infection.  Inform your health care provider if you have a known history of preterm labor. Document Released: 05/11/2003 Document Revised: 10/21/2012 Document Reviewed: 03/23/2012 [facility]  Patient Information 2015 [place]. This information is not intended to replace advice given to you by your health care provider. Make sure you discuss any questions you have with your health care provider.

## 2014-01-05 NOTE — Progress Notes (Signed)
Pt. Reports no problems at with pregnancy. 32w 2d, stable pregnancy.  Denis VB, LOF. +FM, No dysuria, abdominal pain, nausea, or vomiting.  Reports that she has never hepatitis B vaccination.  Otherwise, no complaints.   PE:  HEENT: NCAT, PERRLA, No LAD CV: RRR, No MGR, No TTP Resp: CTA BL Abd; Appropriate for GA., S, NT, Fundus appropriate.  EXT: No TTP of BL LE, 2+ distal pulses in BL extremities.

## 2014-01-19 ENCOUNTER — Ambulatory Visit (INDEPENDENT_AMBULATORY_CARE_PROVIDER_SITE_OTHER): Payer: Medicaid Other | Admitting: Physician Assistant

## 2014-01-19 VITALS — BP 122/76 | HR 102 | Temp 97.6°F | Wt 189.8 lb

## 2014-01-19 DIAGNOSIS — Z3403 Encounter for supervision of normal first pregnancy, third trimester: Secondary | ICD-10-CM

## 2014-01-19 LAB — POCT URINALYSIS DIP (DEVICE)
Bilirubin Urine: NEGATIVE
Glucose, UA: NEGATIVE mg/dL
Hgb urine dipstick: NEGATIVE
KETONES UR: NEGATIVE mg/dL
Nitrite: NEGATIVE
PH: 6.5 (ref 5.0–8.0)
PROTEIN: NEGATIVE mg/dL
SPECIFIC GRAVITY, URINE: 1.015 (ref 1.005–1.030)
Urobilinogen, UA: 1 mg/dL (ref 0.0–1.0)

## 2014-01-19 NOTE — Patient Instructions (Signed)
Braxton Hicks Contractions °Contractions of the uterus can occur throughout pregnancy. Contractions are not always a sign that you are in labor.  °WHAT ARE BRAXTON HICKS CONTRACTIONS?  °Contractions that occur before labor are called Braxton Hicks contractions, or false labor. Toward the end of pregnancy (32-34 weeks), these contractions can develop more often and may become more forceful. This is not true labor because these contractions do not result in opening (dilatation) and thinning of the cervix. They are sometimes difficult to tell apart from true labor because these contractions can be forceful and people have different pain tolerances. You should not feel embarrassed if you go to the hospital with false labor. Sometimes, the only way to tell if you are in true labor is for your health care provider to look for changes in the cervix. °If there are no prenatal problems or other health problems associated with the pregnancy, it is completely safe to be sent home with false labor and await the onset of true labor. °HOW CAN YOU TELL THE DIFFERENCE BETWEEN TRUE AND FALSE LABOR? °False Labor °· The contractions of false labor are usually shorter and not as hard as those of true labor.   °· The contractions are usually irregular.   °· The contractions are often felt in the front of the lower abdomen and in the groin.   °· The contractions may go away when you walk around or change positions while lying down.   °· The contractions get weaker and are shorter lasting as time goes on.   °· The contractions do not usually become progressively stronger, regular, and closer together as with true labor.   °True Labor °· Contractions in true labor last 30-70 seconds, become very regular, usually become more intense, and increase in frequency.   °· The contractions do not go away with walking.   °· The discomfort is usually felt in the top of the uterus and spreads to the lower abdomen and low back.   °· True labor can be  determined by your health care provider with an exam. This will show that the cervix is dilating and getting thinner.   °WHAT TO REMEMBER °· Keep up with your usual exercises and follow other instructions given by your health care provider.   °· Take medicines as directed by your health care provider.   °· Keep your regular prenatal appointments.   °· Eat and drink lightly if you think you are going into labor.   °· If Braxton Hicks contractions are making you uncomfortable:   °¨ Change your position from lying down or resting to walking, or from walking to resting.   °¨ Sit and rest in a tub of warm water.   °¨ Drink 2-3 glasses of water. Dehydration may cause these contractions.   °¨ Do slow and deep breathing several times an hour.   °WHEN SHOULD I SEEK IMMEDIATE MEDICAL CARE? °Seek immediate medical care if: °· Your contractions become stronger, more regular, and closer together.   °· You have fluid leaking or gushing from your vagina.   °· You have a fever.   °· You pass blood-tinged mucus.   °· You have vaginal bleeding.   °· You have continuous abdominal pain.   °· You have low back pain that you never had before.   °· You feel your baby's head pushing down and causing pelvic pressure.   °· Your baby is not moving as much as it used to.   °Document Released: 02/18/2005 Document Revised: 02/23/2013 Document Reviewed: 11/30/2012 °[employer]® Patient Information ©2015 [employer]. This information is not intended to replace advice given to you by your health care   provider. Make sure you discuss any questions you have with your health care provider. ° °

## 2014-01-19 NOTE — Progress Notes (Signed)
34 weeks, stable.  No complaints.  Endorses good fetal movement.  Denies vaginal bleeding, LOF, dysuria. PNV qd Pending urine culture OTC med sheet provided RTC 2 weeks

## 2014-01-20 LAB — URINE CULTURE: Colony Count: 40000

## 2014-02-02 ENCOUNTER — Other Ambulatory Visit: Payer: Self-pay | Admitting: Physician Assistant

## 2014-02-02 ENCOUNTER — Ambulatory Visit (INDEPENDENT_AMBULATORY_CARE_PROVIDER_SITE_OTHER): Payer: Medicaid Other | Admitting: Physician Assistant

## 2014-02-02 VITALS — BP 124/70 | HR 102 | Wt 193.3 lb

## 2014-02-02 DIAGNOSIS — Z3400 Encounter for supervision of normal first pregnancy, unspecified trimester: Secondary | ICD-10-CM

## 2014-02-02 DIAGNOSIS — Z3403 Encounter for supervision of normal first pregnancy, third trimester: Secondary | ICD-10-CM

## 2014-02-02 LAB — OB RESULTS CONSOLE GC/CHLAMYDIA
CHLAMYDIA, DNA PROBE: NEGATIVE
GC PROBE AMP, GENITAL: NEGATIVE

## 2014-02-02 LAB — POCT URINALYSIS DIP (DEVICE)
Bilirubin Urine: NEGATIVE
GLUCOSE, UA: NEGATIVE mg/dL
Hgb urine dipstick: NEGATIVE
KETONES UR: NEGATIVE mg/dL
Nitrite: NEGATIVE
Protein, ur: NEGATIVE mg/dL
SPECIFIC GRAVITY, URINE: 1.015 (ref 1.005–1.030)
Urobilinogen, UA: 1 mg/dL (ref 0.0–1.0)
pH: 7 (ref 5.0–8.0)

## 2014-02-02 LAB — OB RESULTS CONSOLE GBS: STREP GROUP B AG: NEGATIVE

## 2014-02-02 NOTE — Patient Instructions (Signed)
Braxton Hicks Contractions °Contractions of the uterus can occur throughout pregnancy. Contractions are not always a sign that you are in labor.  °WHAT ARE BRAXTON HICKS CONTRACTIONS?  °Contractions that occur before labor are called Braxton Hicks contractions, or false labor. Toward the end of pregnancy (32-34 weeks), these contractions can develop more often and may become more forceful. This is not true labor because these contractions do not result in opening (dilatation) and thinning of the cervix. They are sometimes difficult to tell apart from true labor because these contractions can be forceful and people have different pain tolerances. You should not feel embarrassed if you go to the hospital with false labor. Sometimes, the only way to tell if you are in true labor is for your health care provider to look for changes in the cervix. °If there are no prenatal problems or other health problems associated with the pregnancy, it is completely safe to be sent home with false labor and await the onset of true labor. °HOW CAN YOU TELL THE DIFFERENCE BETWEEN TRUE AND FALSE LABOR? °False Labor °· The contractions of false labor are usually shorter and not as hard as those of true labor.   °· The contractions are usually irregular.   °· The contractions are often felt in the front of the lower abdomen and in the groin.   °· The contractions may go away when you walk around or change positions while lying down.   °· The contractions get weaker and are shorter lasting as time goes on.   °· The contractions do not usually become progressively stronger, regular, and closer together as with true labor.   °True Labor °· Contractions in true labor last 30-70 seconds, become very regular, usually become more intense, and increase in frequency.   °· The contractions do not go away with walking.   °· The discomfort is usually felt in the top of the uterus and spreads to the lower abdomen and low back.   °· True labor can be  determined by your health care provider with an exam. This will show that the cervix is dilating and getting thinner.   °WHAT TO REMEMBER °· Keep up with your usual exercises and follow other instructions given by your health care provider.   °· Take medicines as directed by your health care provider.   °· Keep your regular prenatal appointments.   °· Eat and drink lightly if you think you are going into labor.   °· If Braxton Hicks contractions are making you uncomfortable:   °¨ Change your position from lying down or resting to walking, or from walking to resting.   °¨ Sit and rest in a tub of warm water.   °¨ Drink 2-3 glasses of water. Dehydration may cause these contractions.   °¨ Do slow and deep breathing several times an hour.   °WHEN SHOULD I SEEK IMMEDIATE MEDICAL CARE? °Seek immediate medical care if: °· Your contractions become stronger, more regular, and closer together.   °· You have fluid leaking or gushing from your vagina.   °· You have a fever.   °· You pass blood-tinged mucus.   °· You have vaginal bleeding.   °· You have continuous abdominal pain.   °· You have low back pain that you never had before.   °· You feel your baby's head pushing down and causing pelvic pressure.   °· Your baby is not moving as much as it used to.   °Document Released: 02/18/2005 Document Revised: 02/23/2013 Document Reviewed: 11/30/2012 °[employer]® Patient Information ©2015 [employer]. This information is not intended to replace advice given to you by your health care   provider. Make sure you discuss any questions you have with your health care provider. ° °

## 2014-02-02 NOTE — Progress Notes (Signed)
36 weeks without complaint.  Endorses good fetal movement.  Denies LOF, vag bleeding, dysuria, swelling, headache, vision change.  BP up initially and then fine.  Ate bacon this am.   RTC 1 week Labor precautions

## 2014-02-04 LAB — GC/CHLAMYDIA PROBE AMP
CT Probe RNA: NEGATIVE
GC Probe RNA: NEGATIVE

## 2014-02-08 LAB — CULTURE, BETA STREP (GROUP B ONLY)

## 2014-02-10 ENCOUNTER — Ambulatory Visit (INDEPENDENT_AMBULATORY_CARE_PROVIDER_SITE_OTHER): Payer: Medicaid Other | Admitting: Advanced Practice Midwife

## 2014-02-10 VITALS — BP 108/62 | HR 105 | Temp 98.2°F | Wt 191.4 lb

## 2014-02-10 DIAGNOSIS — Z3403 Encounter for supervision of normal first pregnancy, third trimester: Secondary | ICD-10-CM

## 2014-02-10 LAB — POCT URINALYSIS DIP (DEVICE)
BILIRUBIN URINE: NEGATIVE
Glucose, UA: NEGATIVE mg/dL
Hgb urine dipstick: NEGATIVE
Ketones, ur: NEGATIVE mg/dL
Nitrite: NEGATIVE
Protein, ur: NEGATIVE mg/dL
Specific Gravity, Urine: 1.015 (ref 1.005–1.030)
Urobilinogen, UA: 0.2 mg/dL (ref 0.0–1.0)
pH: 7 (ref 5.0–8.0)

## 2014-02-10 NOTE — Progress Notes (Signed)
Doing well.  Good fetal movement, denies vaginal bleeding, LOF, regular contractions.  Reviewed signs of labor/reasons to come to hospital.   

## 2014-02-14 ENCOUNTER — Encounter: Payer: Self-pay | Admitting: *Deleted

## 2014-02-18 ENCOUNTER — Ambulatory Visit (INDEPENDENT_AMBULATORY_CARE_PROVIDER_SITE_OTHER): Payer: Medicaid Other | Admitting: Advanced Practice Midwife

## 2014-02-18 VITALS — BP 138/77 | HR 102 | Temp 98.4°F | Wt 198.3 lb

## 2014-02-18 DIAGNOSIS — Z3403 Encounter for supervision of normal first pregnancy, third trimester: Secondary | ICD-10-CM

## 2014-02-18 LAB — POCT URINALYSIS DIP (DEVICE)
BILIRUBIN URINE: NEGATIVE
GLUCOSE, UA: NEGATIVE mg/dL
Hgb urine dipstick: NEGATIVE
KETONES UR: NEGATIVE mg/dL
Nitrite: NEGATIVE
PROTEIN: NEGATIVE mg/dL
Specific Gravity, Urine: 1.015 (ref 1.005–1.030)
Urobilinogen, UA: 1 mg/dL (ref 0.0–1.0)
pH: 7 (ref 5.0–8.0)

## 2014-02-18 NOTE — Patient Instructions (Signed)
Braxton Hicks Contractions °Contractions of the uterus can occur throughout pregnancy. Contractions are not always a sign that you are in labor.  °WHAT ARE BRAXTON HICKS CONTRACTIONS?  °Contractions that occur before labor are called Braxton Hicks contractions, or false labor. Toward the end of pregnancy (32-34 weeks), these contractions can develop more often and may become more forceful. This is not true labor because these contractions do not result in opening (dilatation) and thinning of the cervix. They are sometimes difficult to tell apart from true labor because these contractions can be forceful and people have different pain tolerances. You should not feel embarrassed if you go to the hospital with false labor. Sometimes, the only way to tell if you are in true labor is for your health care provider to look for changes in the cervix. °If there are no prenatal problems or other health problems associated with the pregnancy, it is completely safe to be sent home with false labor and await the onset of true labor. °HOW CAN YOU TELL THE DIFFERENCE BETWEEN TRUE AND FALSE LABOR? °False Labor °· The contractions of false labor are usually shorter and not as hard as those of true labor.   °· The contractions are usually irregular.   °· The contractions are often felt in the front of the lower abdomen and in the groin.   °· The contractions may go away when you walk around or change positions while lying down.   °· The contractions get weaker and are shorter lasting as time goes on.   °· The contractions do not usually become progressively stronger, regular, and closer together as with true labor.   °True Labor °· Contractions in true labor last 30-70 seconds, become very regular, usually become more intense, and increase in frequency.   °· The contractions do not go away with walking.   °· The discomfort is usually felt in the top of the uterus and spreads to the lower abdomen and low back.   °· True labor can be  determined by your health care provider with an exam. This will show that the cervix is dilating and getting thinner.   °WHAT TO REMEMBER °· Keep up with your usual exercises and follow other instructions given by your health care provider.   °· Take medicines as directed by your health care provider.   °· Keep your regular prenatal appointments.   °· Eat and drink lightly if you think you are going into labor.   °· If Braxton Hicks contractions are making you uncomfortable:   °· Change your position from lying down or resting to walking, or from walking to resting.   °· Sit and rest in a tub of warm water.   °· Drink 2-3 glasses of water. Dehydration may cause these contractions.   °· Do slow and deep breathing several times an hour.   °WHEN SHOULD I SEEK IMMEDIATE MEDICAL CARE? °Seek immediate medical care if: °· Your contractions become stronger, more regular, and closer together.   °· You have fluid leaking or gushing from your vagina.   °· You have a fever.   °· You pass blood-tinged mucus.   °· You have vaginal bleeding.   °· You have continuous abdominal pain.   °· You have low back pain that you never had before.   °· You feel your baby's head pushing down and causing pelvic pressure.   °· Your baby is not moving as much as it used to.   °Document Released: 02/18/2005 Document Revised: 02/23/2013 Document Reviewed: 11/30/2012 °ExitCare® Patient Information ©2015 [employer]. This information is not intended to replace advice given to you by your health care   provider. Make sure you discuss any questions you have with your health care provider. ° °Hypertension During Pregnancy °Hypertension, or high blood pressure, is when there is extra pressure inside your blood vessels that carry blood from the heart to the rest of your body (arteries). It can happen at any time in life, including pregnancy. Hypertension during pregnancy can cause problems for you and your baby. Your baby might not weigh as much as he or  she should at birth or might be born early (premature). Very bad cases of hypertension during pregnancy can be life-threatening.  °Different types of hypertension can occur during pregnancy. These include: °· Chronic hypertension. This happens when a woman has hypertension before pregnancy and it continues during pregnancy. °· Gestational hypertension. This is when hypertension develops during pregnancy. °· Preeclampsia or toxemia of pregnancy. This is a very serious type of hypertension that develops only during pregnancy. It affects the whole body and can be very dangerous for both mother and baby.   °Gestational hypertension and preeclampsia usually go away after your baby is born. Your blood pressure will likely stabilize within 6 weeks. Women who have hypertension during pregnancy have a greater chance of developing hypertension later in life or with future pregnancies. °RISK FACTORS °There are certain factors that make it more likely for you to develop hypertension during pregnancy. These include: °· Having hypertension before pregnancy. °· Having hypertension during a previous pregnancy. °· Being overweight. °· Being older than 40 years. °· Being pregnant with more than one baby. °· Having diabetes or kidney problems. °SIGNS AND SYMPTOMS °Chronic and gestational hypertension rarely cause symptoms. Preeclampsia has symptoms, which may include: °· Increased protein in your urine. Your health care provider will check for this at every prenatal visit. °· Swelling of your hands and face. °· Rapid weight gain. °· Headaches. °· Visual changes. °· Being bothered by light. °· Abdominal pain, especially in the upper right area. °· Chest pain. °· Shortness of breath. °· Increased reflexes. °· Seizures. These occur with a more severe form of preeclampsia, called eclampsia. °DIAGNOSIS  °You may be diagnosed with hypertension during a regular prenatal exam. At each prenatal visit, you may have: °· Your blood pressure  checked. °· A urine test to check for protein in your urine. °The type of hypertension you are diagnosed with depends on when you developed it. It also depends on your specific blood pressure reading. °· Developing hypertension before 20 weeks of pregnancy is consistent with chronic hypertension. °· Developing hypertension after 20 weeks of pregnancy is consistent with gestational hypertension. °· Hypertension with increased urinary protein is diagnosed as preeclampsia. °· Blood pressure measurements that stay above 160 systolic or 110 diastolic are a sign of severe preeclampsia. °TREATMENT °Treatment for hypertension during pregnancy varies. Treatment depends on the type of hypertension and how serious it is. °· If you take medicine for chronic hypertension, you may need to switch medicines. °¨ Medicines called ACE inhibitors should not be taken during pregnancy. °¨ Low-dose aspirin may be suggested for women who have risk factors for preeclampsia. °· If you have gestational hypertension, you may need to take a blood pressure medicine that is safe during pregnancy. Your health care provider will recommend the correct medicine. °· If you have severe preeclampsia, you may need to be in the hospital. Health care providers will watch you and your baby very closely. You also may need to take medicine called magnesium sulfate to prevent seizures and lower blood pressure. °· Sometimes, an   early delivery is needed. This may be the case if the condition worsens. It would be done to protect you and your baby. The only cure for preeclampsia is delivery. °· Your health care provider may recommend that you take one low-dose aspirin (81 mg) each day to help prevent high blood pressure during your pregnancy if you are at risk for preeclampsia. You may be at risk for preeclampsia if: °¨ You had preeclampsia or eclampsia during a previous pregnancy. °¨ Your baby did not grow as expected during a previous pregnancy. °¨ You  experienced preterm birth with a previous pregnancy. °¨ You experienced a separation of the placenta from the uterus (placental abruption) during a previous pregnancy. °¨ You experienced the loss of your baby during a previous pregnancy. °¨ You are pregnant with more than one baby. °¨ You have other medical conditions, such as diabetes or an autoimmune disease. °HOME CARE INSTRUCTIONS °· Schedule and keep all of your regular prenatal care appointments. This is important. °· Take medicines only as directed by your health care provider. Tell your health care provider about all medicines you take. °· Eat as little salt as possible. °· Get regular exercise. °· Do not drink alcohol. °· Do not use tobacco products. °· Do not drink products with caffeine. °· Lie on your left side when resting. °SEEK IMMEDIATE MEDICAL CARE IF: °· You have severe abdominal pain. °· You have sudden swelling in your hands, ankles, or face. °· You gain 4 pounds (1.8 kg) or more in 1 week. °· You vomit repeatedly. °· You have vaginal bleeding. °· You do not feel your baby moving as much. °· You have a headache. °· You have blurred or double vision. °· You have muscle twitching or spasms. °· You have shortness of breath. °· You have blue fingernails or lips. °· You have blood in your urine. °MAKE SURE YOU: °· Understand these instructions. °· Will watch your condition. °· Will get help right away if you are not doing well or get worse. °Document Released: 11/06/2010 Document Revised: 07/05/2013 Document Reviewed: 09/17/2012 °ExitCare® Patient Information ©2015 [employer]. This information is not intended to replace advice given to you by your health care provider. Make sure you discuss any questions you have with your health care provider. ° °

## 2014-02-18 NOTE — Progress Notes (Signed)
Pre-E precautions. Active baby. GBS neg

## 2014-02-23 ENCOUNTER — Encounter: Payer: Self-pay | Admitting: Obstetrics and Gynecology

## 2014-02-23 ENCOUNTER — Ambulatory Visit (INDEPENDENT_AMBULATORY_CARE_PROVIDER_SITE_OTHER): Payer: Medicaid Other | Admitting: Obstetrics and Gynecology

## 2014-02-23 VITALS — BP 125/76 | HR 120 | Wt 196.8 lb

## 2014-02-23 DIAGNOSIS — Z3403 Encounter for supervision of normal first pregnancy, third trimester: Secondary | ICD-10-CM

## 2014-02-23 LAB — POCT URINALYSIS DIP (DEVICE)
BILIRUBIN URINE: NEGATIVE
Glucose, UA: NEGATIVE mg/dL
Hgb urine dipstick: NEGATIVE
Ketones, ur: NEGATIVE mg/dL
NITRITE: NEGATIVE
Protein, ur: NEGATIVE mg/dL
Specific Gravity, Urine: 1.015 (ref 1.005–1.030)
UROBILINOGEN UA: 2 mg/dL — AB (ref 0.0–1.0)
pH: 7 (ref 5.0–8.0)

## 2014-02-23 NOTE — Progress Notes (Signed)
Patient is doing well without complaints. FM/labor precautions reviewed 

## 2014-02-28 ENCOUNTER — Inpatient Hospital Stay (HOSPITAL_COMMUNITY)
Admission: AD | Admit: 2014-02-28 | Discharge: 2014-03-03 | DRG: 775 | Disposition: A | Discharge status: Home / Self Care | Payer: Medicaid Other | Source: Ambulatory Visit | Attending: Obstetrics & Gynecology | Admitting: Obstetrics & Gynecology

## 2014-02-28 ENCOUNTER — Encounter (HOSPITAL_COMMUNITY): Payer: Self-pay | Admitting: *Deleted

## 2014-02-28 ENCOUNTER — Inpatient Hospital Stay (HOSPITAL_COMMUNITY): Payer: Medicaid Other | Admitting: Anesthesiology

## 2014-02-28 DIAGNOSIS — Z833 Family history of diabetes mellitus: Secondary | ICD-10-CM

## 2014-02-28 DIAGNOSIS — Z3A40 40 weeks gestation of pregnancy: Secondary | ICD-10-CM | POA: Diagnosis present

## 2014-02-28 DIAGNOSIS — Z8249 Family history of ischemic heart disease and other diseases of the circulatory system: Secondary | ICD-10-CM

## 2014-02-28 DIAGNOSIS — IMO0001 Reserved for inherently not codable concepts without codable children: Secondary | ICD-10-CM

## 2014-02-28 HISTORY — DX: Other specified health status: Z78.9

## 2014-02-28 LAB — CBC
HCT: 36.9 % (ref 36.0–46.0)
HEMOGLOBIN: 12.3 g/dL (ref 12.0–15.0)
MCH: 28.8 pg (ref 26.0–34.0)
MCHC: 33.3 g/dL (ref 30.0–36.0)
MCV: 86.4 fL (ref 78.0–100.0)
Platelets: 188 10*3/uL (ref 150–400)
RBC: 4.27 MIL/uL (ref 3.87–5.11)
RDW: 14 % (ref 11.5–15.5)
WBC: 10.7 10*3/uL — AB (ref 4.0–10.5)

## 2014-02-28 LAB — TYPE AND SCREEN
ABO/RH(D): A POS
ANTIBODY SCREEN: NEGATIVE

## 2014-02-28 LAB — ABO/RH: ABO/RH(D): A POS

## 2014-02-28 MED ORDER — LACTATED RINGERS IV SOLN
INTRAVENOUS | Status: DC
  Administered 2014-02-28 – 2014-03-01 (×3): via INTRAVENOUS

## 2014-02-28 MED ORDER — ACETAMINOPHEN 325 MG PO TABS: 650.0000 mg | ORAL_TABLET | ORAL | Status: DC | PRN

## 2014-02-28 MED ORDER — DIPHENHYDRAMINE HCL 50 MG/ML IJ SOLN: 12.5000 mg | INTRAMUSCULAR | Status: DC | PRN

## 2014-02-28 MED ORDER — OXYTOCIN BOLUS FROM INFUSION
500.0000 mL | INTRAVENOUS | Status: DC
  Administered 2014-03-01: 500 mL via INTRAVENOUS

## 2014-02-28 MED ORDER — LACTATED RINGERS IV SOLN: 500.0000 mL | INTRAVENOUS | Status: DC | PRN

## 2014-02-28 MED ORDER — CITRIC ACID-SODIUM CITRATE 334-500 MG/5ML PO SOLN: 30.0000 mL | ORAL | Status: DC | PRN

## 2014-02-28 MED ORDER — LIDOCAINE HCL (PF) 1 % IJ SOLN
30.0000 mL | INTRAMUSCULAR | Status: DC | PRN
  Filled 2014-02-28: qty 30

## 2014-02-28 MED ORDER — FENTANYL 2.5 MCG/ML BUPIVACAINE 1/10 % EPIDURAL INFUSION (WH - ANES)
14.0000 mL/h | INTRAMUSCULAR | Status: DC | PRN
  Administered 2014-02-28: 14 mL/h via EPIDURAL
  Filled 2014-02-28: qty 125

## 2014-02-28 MED ORDER — TERBUTALINE SULFATE 1 MG/ML IJ SOLN
0.2500 mg | Freq: Once | INTRAMUSCULAR | Status: AC | PRN
Start: 2014-02-28 — End: 2014-02-28

## 2014-02-28 MED ORDER — LIDOCAINE HCL (PF) 1 % IJ SOLN
INTRAMUSCULAR | Status: DC | PRN
  Administered 2014-02-28 (×2): 5 mL

## 2014-02-28 MED ORDER — OXYCODONE-ACETAMINOPHEN 5-325 MG PO TABS: 2.0000 | ORAL_TABLET | ORAL | Status: DC | PRN

## 2014-02-28 MED ORDER — EPHEDRINE 5 MG/ML INJ
10.0000 mg | INTRAVENOUS | Status: DC | PRN
  Filled 2014-02-28: qty 2

## 2014-02-28 MED ORDER — PHENYLEPHRINE 40 MCG/ML (10ML) SYRINGE FOR IV PUSH (FOR BLOOD PRESSURE SUPPORT)
80.0000 ug | PREFILLED_SYRINGE | INTRAVENOUS | Status: DC | PRN
  Filled 2014-02-28: qty 10
  Filled 2014-02-28: qty 2

## 2014-02-28 MED ORDER — OXYTOCIN 40 UNITS IN LACTATED RINGERS INFUSION - SIMPLE MED
62.5000 mL/h | INTRAVENOUS | Status: DC
  Administered 2014-03-01: 62.5 mL/h via INTRAVENOUS

## 2014-02-28 MED ORDER — OXYTOCIN 40 UNITS IN LACTATED RINGERS INFUSION - SIMPLE MED
1.0000 m[IU]/min | INTRAVENOUS | Status: DC
  Administered 2014-02-28: 1 m[IU]/min via INTRAVENOUS
  Filled 2014-02-28: qty 1000

## 2014-02-28 MED ORDER — ONDANSETRON HCL 4 MG/2ML IJ SOLN: 4.0000 mg | Freq: Four times a day (QID) | INTRAMUSCULAR | Status: DC | PRN

## 2014-02-28 MED ORDER — LACTATED RINGERS IV SOLN
500.0000 mL | Freq: Once | INTRAVENOUS | Status: AC
  Administered 2014-02-28: 500 mL via INTRAVENOUS

## 2014-02-28 MED ORDER — OXYCODONE-ACETAMINOPHEN 5-325 MG PO TABS: 1.0000 | ORAL_TABLET | ORAL | Status: DC | PRN

## 2014-02-28 MED ORDER — PHENYLEPHRINE 40 MCG/ML (10ML) SYRINGE FOR IV PUSH (FOR BLOOD PRESSURE SUPPORT)
80.0000 ug | PREFILLED_SYRINGE | INTRAVENOUS | Status: DC | PRN
Start: 2014-02-28 — End: 2014-03-01
  Filled 2014-02-28: qty 2

## 2014-02-28 NOTE — MAU Provider Note (Signed)
Zoanne Bennison 28 y.o.G1P0000 [redacted]w[redacted]d Presented for labor check SVE 1 cm per RN SSE to R/O SROM> fern neg Cx 4/80/-2 bulging, show See L&D Admit note Danae OrleansDeirdre C Judd Mccubbin, CNM 02/28/2014 1:37 PM

## 2014-02-28 NOTE — H&P (Signed)
Kayla Reilly is a 28 y.o. female G1 40.0 wkspresenting for SOL. Maternal Medical History:  Reason for admission: Contractions.  Nausea.  Contractions: Onset was 3-5 hours ago.   Frequency: regular.   Perceived severity is moderate.    Fetal activity: Perceived fetal activity is normal.   Last perceived fetal movement was within the past hour.    Prenatal complications: no prenatal complications Prenatal Complications - Diabetes: none.    OB History    Gravida Para Term Preterm AB TAB SAB Ectopic Multiple Living   1 0 0 0 0 0 0 0 0 0      Past Medical History  Diagnosis Date  . Acne vulgaris   . Abnormal Pap smear     prior  . Medical history non-contributory    Past Surgical History  Procedure Laterality Date  . Root canal    . Colposcopy     Family History: family history includes Cancer in her maternal grandmother; Diabetes in her mother; Hypertension in her mother. Social History:  reports that she has never smoked. She has never used smokeless tobacco. She reports that she does not drink alcohol or use illicit drugs.   Prenatal Transfer Tool  Maternal Diabetes: No Genetic Screening: Normal Maternal Ultrasounds/Referrals: Declined Fetal Ultrasounds or other Referrals:  None Maternal Substance Abuse:  No Significant Maternal Medications:  None Significant Maternal Lab Results:  Lab values include: Group B Strep negative Other Comments:  None  Review of Systems  Constitutional: Negative for fever.  Respiratory: Negative for cough.   Cardiovascular: Negative for palpitations.  Gastrointestinal: Negative for nausea and vomiting.  Genitourinary: Negative for dysuria.  Neurological: Negative for dizziness and headaches.  Psychiatric/Behavioral: Negative for depression.   Filed Vitals:   02/28/14 1022  BP: 130/80  Pulse: 101  Temp: 98 F (36.7 C)  Resp: 18    Dilation: 4 Effacement (%): 80 Station: -2 Exam by:: D Normon Pettijohn CNM Blood pressure 130/80, pulse  101, temperature 98 F (36.7 C), temperature source Oral, resp. rate 18, height 5\' 5"  (1.651 m), weight 90.719 kg (200 lb), last menstrual period 07/13/2013. Maternal Exam:  Uterine Assessment: Contraction strength is moderate.  Abdomen: Estimated fetal weight is 8#.    Introitus: Normal vulva. Normal vagina.  Ferning test: negative.  Nitrazine test: not done. Amniotic fluid character: not assessed.  Pelvis: adequate for delivery.   Cervix: Cervix evaluated by digital exam.     Fetal Exam Fetal Monitor Review: Mode: ultrasound.   Baseline rate: 140.  Variability: moderate (6-25 bpm).   Pattern: no decelerations and accelerations present.    Fetal State Assessment: Category I - tracings are normal.     Physical Exam  Nursing note and vitals reviewed. Constitutional: She is oriented to person, place, and time. She appears well-developed and well-nourished. No distress.  HENT:  Head: Normocephalic.  Eyes: Pupils are equal, round, and reactive to light.  Neck: Normal range of motion. Neck supple. No thyromegaly present.  Cardiovascular: Normal rate, regular rhythm and normal heart sounds.   Respiratory: Effort normal and breath sounds normal.  GI: She exhibits distension. There is no tenderness.  Genitourinary: Vagina normal and uterus normal.  Musculoskeletal: Normal range of motion.  Neurological: She is alert and oriented to person, place, and time. She has normal reflexes.  Skin: Skin is warm and dry.  Psychiatric: She has a normal mood and affect.    Prenatal labs: ABO, Rh: --/--/A POS (07/25 1923) Antibody: NEG (09/14 1130) Rubella:   RPR:  NON REAC (10/06 1308)  HBsAg: NEGATIVE (08/18 1458)  HIV: NONREACTIVE (10/06 1308)  GBS: Negative (12/02 0000)   Assessment/Plan: G1 at 40wks in early active phase labor Expectant management   Waldon Sheerin 02/28/2014, 1:54 PM

## 2014-02-28 NOTE — Anesthesia Preprocedure Evaluation (Signed)

## 2014-02-28 NOTE — Anesthesia Procedure Notes (Signed)
Epidural Patient location during procedure: OB Start time: 02/28/2014 10:45 PM  Staffing Anesthesiologist: Brayton CavesJACKSON, Kamala Kolton Performed by: anesthesiologist   Preanesthetic Checklist Completed: patient identified, site marked, surgical consent, pre-op evaluation, timeout performed, IV checked, risks and benefits discussed and monitors and equipment checked  Epidural Patient position: sitting Prep: site prepped and draped and DuraPrep Patient monitoring: continuous pulse ox and blood pressure Approach: midline Location: L3-L4 Injection technique: LOR air  Needle:  Needle type: Tuohy  Needle gauge: 17 G Needle length: 9 cm and 9 Needle insertion depth: 7 cm Catheter type: closed end flexible Catheter size: 19 Gauge Catheter at skin depth: 12 cm Test dose: negative  Assessment Events: blood not aspirated, injection not painful, no injection resistance, negative IV test and no paresthesia  Additional Notes Patient identified.  Risk benefits discussed including failed block, incomplete pain control, headache, nerve damage, paralysis, blood pressure changes, nausea, vomiting, reactions to medication both toxic or allergic, and postpartum back pain.  Patient expressed understanding and wished to proceed.  All questions were answered.  Sterile technique used throughout procedure and epidural site dressed with sterile barrier dressing. No paresthesia or other complications noted.The patient did not experience any signs of intravascular injection such as tinnitus or metallic taste in mouth nor signs of intrathecal spread such as rapid motor block. Please see nursing notes for vital signs.

## 2014-02-28 NOTE — Progress Notes (Signed)
Patient ID: Kayla Reilly, female   DOB: 10/24/1985, 28 y.o.   MRN: [id number]  Filed Vitals:   02/28/14 1756 02/28/14 1853 02/28/14 1943 02/28/14 2000  BP: 113/67 129/79 121/75 116/72  Pulse: 110 103 102 101  Temp:  98.6 F (37 C) 98.8 F (37.1 C)   TempSrc:  Oral Oral   Resp: 16 16 18 18   Height:      Weight:      SpO2:       FHR 140 with accels to 160 UCs every 3 min  Dilation: 4 Effacement (%): 50 Cervical Position: Posterior Station: -2 Presentation: Vertex Exam by:: lee  On Pitocin

## 2014-02-28 NOTE — MAU Note (Signed)
Contractions since last night, getting closer and stronger.  Lost plug. ? Leakage at times

## 2014-03-01 ENCOUNTER — Encounter (HOSPITAL_COMMUNITY): Payer: Self-pay | Admitting: *Deleted

## 2014-03-01 DIAGNOSIS — Z3A40 40 weeks gestation of pregnancy: Secondary | ICD-10-CM | POA: Diagnosis not present

## 2014-03-01 LAB — RPR

## 2014-03-01 LAB — HIV ANTIBODY (ROUTINE TESTING W REFLEX): HIV 1&2 Ab, 4th Generation: NONREACTIVE

## 2014-03-01 MED ORDER — OXYCODONE-ACETAMINOPHEN 5-325 MG PO TABS: 2.0000 | ORAL_TABLET | ORAL | Status: DC | PRN

## 2014-03-01 MED ORDER — DIPHENHYDRAMINE HCL 25 MG PO CAPS: 25.0000 mg | ORAL_CAPSULE | Freq: Four times a day (QID) | ORAL | Status: DC | PRN

## 2014-03-01 MED ORDER — MEASLES, MUMPS & RUBELLA VAC ~~LOC~~ INJ
0.5000 mL | INJECTION | Freq: Once | SUBCUTANEOUS | Status: AC
  Administered 2014-03-03: 0.5 mL via SUBCUTANEOUS
  Filled 2014-03-01: qty 0.5

## 2014-03-01 MED ORDER — IBUPROFEN 600 MG PO TABS
600.0000 mg | ORAL_TABLET | Freq: Four times a day (QID) | ORAL | Status: DC
  Administered 2014-03-01 – 2014-03-03 (×9): 600 mg via ORAL
  Filled 2014-03-01 (×9): qty 1

## 2014-03-01 MED ORDER — LANOLIN HYDROUS EX OINT: TOPICAL_OINTMENT | CUTANEOUS | Status: DC | PRN

## 2014-03-01 MED ORDER — SIMETHICONE 80 MG PO CHEW: 80.0000 mg | CHEWABLE_TABLET | ORAL | Status: DC | PRN

## 2014-03-01 MED ORDER — DIBUCAINE 1 % RE OINT: 1.0000 "application " | TOPICAL_OINTMENT | RECTAL | Status: DC | PRN

## 2014-03-01 MED ORDER — SENNOSIDES-DOCUSATE SODIUM 8.6-50 MG PO TABS
2.0000 | ORAL_TABLET | ORAL | Status: DC
  Administered 2014-03-01 – 2014-03-02 (×2): 2 via ORAL
  Filled 2014-03-01 (×2): qty 2

## 2014-03-01 MED ORDER — ONDANSETRON HCL 4 MG/2ML IJ SOLN
4.0000 mg | INTRAMUSCULAR | Status: DC | PRN
Start: 2014-03-01 — End: 2014-03-03

## 2014-03-01 MED ORDER — WITCH HAZEL-GLYCERIN EX PADS: 1.0000 "application " | MEDICATED_PAD | CUTANEOUS | Status: DC | PRN

## 2014-03-01 MED ORDER — TETANUS-DIPHTH-ACELL PERTUSSIS 5-2.5-18.5 LF-MCG/0.5 IM SUSP: 0.5000 mL | Freq: Once | INTRAMUSCULAR | Status: DC

## 2014-03-01 MED ORDER — OXYCODONE-ACETAMINOPHEN 5-325 MG PO TABS: 1.0000 | ORAL_TABLET | ORAL | Status: DC | PRN

## 2014-03-01 MED ORDER — BENZOCAINE-MENTHOL 20-0.5 % EX AERO: 1.0000 "application " | INHALATION_SPRAY | CUTANEOUS | Status: DC | PRN

## 2014-03-01 MED ORDER — ZOLPIDEM TARTRATE 5 MG PO TABS
5.0000 mg | ORAL_TABLET | Freq: Every evening | ORAL | Status: DC | PRN
Start: 2014-03-01 — End: 2014-03-03

## 2014-03-01 MED ORDER — ONDANSETRON HCL 4 MG PO TABS: 4.0000 mg | ORAL_TABLET | ORAL | Status: DC | PRN

## 2014-03-01 MED ORDER — PRENATAL MULTIVITAMIN CH
1.0000 | ORAL_TABLET | Freq: Every day | ORAL | Status: DC
  Administered 2014-03-01 – 2014-03-02 (×2): 1 via ORAL
  Filled 2014-03-01 (×2): qty 1

## 2014-03-01 NOTE — Lactation Note (Signed)
This note was copied from the chart of Kayla Maja Rettinger. Lactation Consultation Note Baby is asleep but mom reports that BF is going well. Educated on feeding cues, frequency of feeds, breast care and output.  Asked mom to call out for latch checks every 8 hours.  Follow-up tomorrow.  Patient Name: Kayla Reilly ZOXWR'UToday's Date: 03/01/2014 Reason for consult: Initial assessment   Maternal Data Has patient been taught Hand Expression?: Yes (reports that she has been taught) Does the patient have breastfeeding experience prior to this delivery?: No  Feeding Feeding Type: Breast Fed Length of feed: 20 min  LATCH Score/Interventions Latch: Grasps breast easily, tongue down, lips flanged, rhythmical sucking.  Audible Swallowing: None  Type of Nipple: Everted at rest and after stimulation  Comfort (Breast/Nipple): Soft / non-tender     Hold (Positioning): Assistance needed to correctly position infant at breast and maintain latch.  LATCH Score: 7  Lactation Tools Discussed/Used     Consult Status Consult Status: Follow-up Date: 03/02/14    Soyla DryerJoseph, Randal Goens 03/01/2014, 10:25 AM

## 2014-03-01 NOTE — Anesthesia Postprocedure Evaluation (Signed)
  Anesthesia Post-op Note  Patient: Kayla Reilly  Procedure(s) Performed: * No procedures listed *  Patient Location: Mother/Baby  Anesthesia Type:Epidural  Level of Consciousness: awake  Airway and Oxygen Therapy: Patient Spontanous Breathing  Post-op Pain: mild  Post-op Assessment: Patient's Cardiovascular Status Stable and Respiratory Function Stable  Post-op Vital Signs: stable  Last Vitals:  Filed Vitals:   03/01/14 0800  BP: 118/74  Pulse: 92  Temp: 37.3 C  Resp: 18    Complications: No apparent anesthesia complications

## 2014-03-02 ENCOUNTER — Encounter: Payer: Medicaid Other | Admitting: Physician Assistant

## 2014-03-02 NOTE — Lactation Note (Signed)
This note was copied from the chart of Kayla Lawanda Gotts. Lactation Consultation Note Follow up visit at 40 hours of age.  Mom reports baby just finished a 30 minute feeding.  Baby has had 7 feedings with 3voids and 1 stool in the past 24 hours.  Last stool was 0100 (>18 hours ago).  Mom reports she is hearing swallows, denies pain and baby is getting a deep latch.  Last latch recorded was >24 hours ago.  Encouraged mom to call with next feeding to have MBU RN do LATCH score to facilitate discharge.  Discussed expected out put for each day of life with mom and FOB.  Mom to call for assist as needed.     Patient Name: Kayla Lurline DelShikira Lupton QIONG'EToday's Date: 03/02/2014 Reason for consult: Follow-up assessment   Maternal Data    Feeding Feeding Type: Breast Fed Length of feed: 30 min  LATCH Score/Interventions                Intervention(s): Breastfeeding basics reviewed     Lactation Tools Discussed/Used     Consult Status Consult Status: Follow-up Date: 03/03/14 Follow-up type: In-patient    Jannifer RodneyShoptaw, Jana Lynn 03/02/2014, 9:43 PM

## 2014-03-02 NOTE — Progress Notes (Signed)
Post Partum Day 1 Subjective: no complaints, up ad lib, voiding and tolerating PO, small lochia, plans to breastfeed, undecidead about birth control. Options reviewed  Objective: Blood pressure 114/75, pulse 101, temperature 98.6 F (37 C), temperature source Oral, resp. rate 18, height 5\' 5"  (1.651 m), weight 90.719 kg (200 lb), last menstrual period 07/13/2013, SpO2 99 %, unknown if currently breastfeeding.  Physical Exam:  General: alert, cooperative and no distress Lochia:normal flow Chest: CTAB Heart: RRR no m/r/g Abdomen: +BS, soft, nontender,  Uterine Fundus: firm DVT Evaluation: No evidence of DVT seen on physical exam. Extremities: no edema   Recent Labs  02/28/14 1345  HGB 12.3  HCT 36.9    Assessment/Plan: Plan for discharge tomorrow, Breastfeeding and Lactation consult   LOS: 2 days   CRESENZO-DISHMAN,Jacobi Ryant 03/02/2014, 7:47 AM

## 2014-03-03 MED ORDER — IBUPROFEN 600 MG PO TABS: 600.0000 mg | ORAL_TABLET | Freq: Four times a day (QID) | ORAL | Status: DC

## 2014-03-03 NOTE — Discharge Summary (Signed)
Obstetric Discharge Summary Reason for Admission: onset of labor Prenatal Procedures: none Intrapartum Procedures: spontaneous vaginal delivery Postpartum Procedures: none Complications-Operative and Postpartum: periurethral laceration HEMOGLOBIN  Date Value Ref Range Status  02/28/2014 12.3 12.0 - 15.0 g/dL Final   HCT  Date Value Ref Range Status  02/28/2014 36.9 36.0 - 46.0 % Final    Delivery Note At 5:16 AM a viable and healthy female was delivered via Vaginal, Spontaneous Delivery (Presentation: ; ). APGAR: 8, 9; weight .  Placenta status: Intact, Spontaneous. Cord: 3 vessels with the following complications: Tight nuchal cord  Anesthesia: Epidural  Episiotomy: None Lacerations: Periurethral Suture Repair: 3.0 monocryl Est. Blood Loss (mL):   Physical Exam:  General: alert, cooperative and no distress Lochia: appropriate Uterine Fundus: firm, NT DVT Evaluation: No evidence of DVT seen on physical exam. No cords or calf tenderness.  Discharge Diagnoses: Term Pregnancy-delivered  She remains undecided on contraception  Discharge Information: Date: 03/03/2014 Activity: pelvic rest Diet: routine Medications: PNV and Ibuprofen Condition: stable Instructions: refer to practice specific booklet Discharge to: home Follow-up Information    [facility]   Specialty:  Obstetrics and Gynecology   Why:  An appointment will be made for you to follow up in 4-5 weeks   Contact information:   [address] [place] [id number] [phone]      Newborn Data: Live born female  Birth Weight: 7 lb 11.5 oz (3500 g) APGAR: 8, 9  Home with mother.  Dejai Schubach 03/03/2014, 10:35 AM

## 2014-03-03 NOTE — Lactation Note (Signed)
This note was copied from the chart of Kayla Reilly. Lactation Consultation Note    Follow up consult with this mopm and baby, now 10552 hours old. Mom reports breast feding going well, baby cluster feeing during the night, and passing stools well now. I reviewed best care with mom, and gave her a manual hand pump, and instructed her in it's use. Patient Name: Kayla Reilly ZOXWR'UToday's Date: 03/03/2014 Reason for consult: Follow-up assessment   Maternal Data    Feeding Feeding Type: Breast Fed Length of feed: 30 min  LATCH Score/Interventions                      Lactation Tools Discussed/Used     Consult Status Consult Status: Complete Follow-up type: Call as needed    Alfred LevinsLee, Kori Colin Anne 03/03/2014, 9:28 AM

## 2014-03-03 NOTE — Discharge Instructions (Signed)
Postpartum Care After Vaginal Delivery °After you deliver your newborn (postpartum period), the usual stay in the hospital is 24-72 hours. If there were problems with your labor or delivery, or if you have other medical problems, you might be in the hospital longer.  °While you are in the hospital, you will receive help and instructions on how to care for yourself and your newborn during the postpartum period.  °While you are in the hospital: °· Be sure to tell your nurses if you have pain or discomfort, as well as where you feel the pain and what makes the pain worse. °· If you had an incision made near your vagina (episiotomy) or if you had some tearing during delivery, the nurses may put ice packs on your episiotomy or tear. The ice packs may help to reduce the pain and swelling. °· If you are breastfeeding, you may feel uncomfortable contractions of your uterus for a couple of weeks. This is normal. The contractions help your uterus get back to normal size. °· It is normal to have some bleeding after delivery. °· For the first 1-3 days after delivery, the flow is red and the amount may be similar to a period. °· It is common for the flow to start and stop. °· In the first few days, you may pass some small clots. Let your nurses know if you begin to pass large clots or your flow increases. °· Do not  flush blood clots down the toilet before having the nurse look at them. °· During the next 3-10 days after delivery, your flow should become more watery and pink or brown-tinged in color. °· Ten to fourteen days after delivery, your flow should be a small amount of yellowish-white discharge. °· The amount of your flow will decrease over the first few weeks after delivery. Your flow may stop in 6-8 weeks. Most women have had their flow stop by 12 weeks after delivery. °· You should change your sanitary pads frequently. °· Wash your hands thoroughly with soap and water for at least 20 seconds after changing pads, using  the toilet, or before holding or feeding your newborn. °· You should feel like you need to empty your bladder within the first 6-8 hours after delivery. °· In case you become weak, lightheaded, or faint, call your nurse before you get out of bed for the first time and before you take a shower for the first time. °· Within the first few days after delivery, your breasts may begin to feel tender and full. This is called engorgement. Breast tenderness usually goes away within 48-72 hours after engorgement occurs. You may also notice milk leaking from your breasts. If you are not breastfeeding, do not stimulate your breasts. Breast stimulation can make your breasts produce more milk. °· Spending as much time as possible with your newborn is very important. During this time, you and your newborn can feel close and get to know each other. Having your newborn stay in your room (rooming in) will help to strengthen the bond with your newborn.  It will give you time to get to know your newborn and become comfortable caring for your newborn. °· Your hormones change after delivery. Sometimes the hormone changes can temporarily cause you to feel sad or tearful. These feelings should not last more than a few days. If these feelings last longer than that, you should talk to your caregiver. °· If desired, talk to your caregiver about methods of family planning or contraception. °·   Talk to your caregiver about immunizations. Your caregiver may want you to have the following immunizations before leaving the hospital: °· Tetanus, diphtheria, and pertussis (Tdap) or tetanus and diphtheria (Td) immunization. It is very important that you and your family (including grandparents) or others caring for your newborn are up-to-date with the Tdap or Td immunizations. The Tdap or Td immunization can help protect your newborn from getting ill. °· Rubella immunization. °· Varicella (chickenpox) immunization. °· Influenza immunization. You should  receive this annual immunization if you did not receive the immunization during your pregnancy. °Document Released: 12/16/2006 Document Revised: 11/13/2011 Document Reviewed: 10/16/2011 °Exit[employer]® Patient Information ©2015 [employer]. This information is not intended to replace advice given to you by your health care provider. Make sure you discuss any questions you have with your health care provider. ° °Contraception Choices °Contraception (birth control) is the use of any methods or devices to prevent pregnancy. Below are some methods to help avoid pregnancy. °HORMONAL METHODS  °· Contraceptive implant. This is a thin, plastic tube containing progesterone hormone. It does not contain estrogen hormone. Your health care provider inserts the tube in the inner part of the upper arm. The tube can remain in place for up to 3 years. After 3 years, the implant must be removed. The implant prevents the ovaries from releasing an egg (ovulation), thickens the cervical mucus to prevent sperm from entering the uterus, and thins the lining of the inside of the uterus. °· Progesterone-only injections. These injections are given every 3 months by your health care provider to prevent pregnancy. This synthetic progesterone hormone stops the ovaries from releasing eggs. It also thickens cervical mucus and changes the uterine lining. This makes it harder for sperm to survive in the uterus. °· Birth control pills. These pills contain estrogen and progesterone hormone. They work by preventing the ovaries from releasing eggs (ovulation). They also cause the cervical mucus to thicken, preventing the sperm from entering the uterus. Birth control pills are prescribed by a health care provider. Birth control pills can also be used to treat heavy periods. °· Minipill. This type of birth control pill contains only the progesterone hormone. They are taken every day of each month and must be prescribed by your health care provider. °· Birth  control patch. The patch contains hormones similar to those in birth control pills. It must be changed once a week and is prescribed by a health care provider. °· Vaginal ring. The ring contains hormones similar to those in birth control pills. It is left in the vagina for 3 weeks, removed for 1 week, and then a new one is put back in place. The patient must be comfortable inserting and removing the ring from the vagina. A health care provider's prescription is necessary. °· Emergency contraception. Emergency contraceptives prevent pregnancy after unprotected sexual intercourse. This pill can be taken right after sex or up to 5 days after unprotected sex. It is most effective the sooner you take the pills after having sexual intercourse. Most emergency contraceptive pills are available without a prescription. Check with your pharmacist. Do not use emergency contraception as your only form of birth control. °BARRIER METHODS  °· Female condom. This is a thin sheath (latex or rubber) that is worn over the penis during sexual intercourse. It can be used with spermicide to increase effectiveness. °· Female condom. This is a soft, loose-fitting sheath that is put into the vagina before sexual intercourse. °· Diaphragm. This is a soft, latex, dome-shaped barrier that   must be fitted by a health care provider. It is inserted into the vagina, along with a spermicidal jelly. It is inserted before intercourse. The diaphragm should be left in the vagina for 6 to 8 hours after intercourse. °· Cervical cap. This is a round, soft, latex or plastic cup that fits over the cervix and must be fitted by a health care provider. The cap can be left in place for up to 48 hours after intercourse. °· Sponge. This is a soft, circular piece of polyurethane foam. The sponge has spermicide in it. It is inserted into the vagina after wetting it and before sexual intercourse. °· Spermicides. These are chemicals that kill or block sperm from entering  the cervix and uterus. They come in the form of creams, jellies, suppositories, foam, or tablets. They do not require a prescription. They are inserted into the vagina with an applicator before having sexual intercourse. The process must be repeated every time you have sexual intercourse. °INTRAUTERINE CONTRACEPTION °· Intrauterine device (IUD). This is a T-shaped device that is put in a woman's uterus during a menstrual period to prevent pregnancy. There are 2 types: °¨ Copper IUD. This type of IUD is wrapped in copper wire and is placed inside the uterus. Copper makes the uterus and fallopian tubes produce a fluid that kills sperm. It can stay in place for 10 years. °¨ Hormone IUD. This type of IUD contains the hormone progestin (synthetic progesterone). The hormone thickens the cervical mucus and prevents sperm from entering the uterus, and it also thins the uterine lining to prevent implantation of a fertilized egg. The hormone can weaken or kill the sperm that get into the uterus. It can stay in place for 3-5 years, depending on which type of IUD is used. °PERMANENT METHODS OF CONTRACEPTION °· Female tubal ligation. This is when the woman's fallopian tubes are surgically sealed, tied, or blocked to prevent the egg from traveling to the uterus. °· Hysteroscopic sterilization. This involves placing a small coil or insert into each fallopian tube. Your doctor uses a technique called hysteroscopy to do the procedure. The device causes scar tissue to form. This results in permanent blockage of the fallopian tubes, so the sperm cannot fertilize the egg. It takes about 3 months after the procedure for the tubes to become blocked. You must use another form of birth control for these 3 months. °· Female sterilization. This is when the female has the tubes that carry sperm tied off (vasectomy). This blocks sperm from entering the vagina during sexual intercourse. After the procedure, the man can still ejaculate fluid  (semen). °NATURAL PLANNING METHODS °· Natural family planning. This is not having sexual intercourse or using a barrier method (condom, diaphragm, cervical cap) on days the woman could become pregnant. °· Calendar method. This is keeping track of the length of each menstrual cycle and identifying when you are fertile. °· Ovulation method. This is avoiding sexual intercourse during ovulation. °· Symptothermal method. This is avoiding sexual intercourse during ovulation, using a thermometer and ovulation symptoms. °· Post-ovulation method. This is timing sexual intercourse after you have ovulated. °Regardless of which type or method of contraception you choose, it is important that you use condoms to protect against the transmission of sexually transmitted infections (STIs). Talk with your health care provider about which form of contraception is most appropriate for you. °Document Released: 02/18/2005 Document Revised: 02/23/2013 Document Reviewed: 08/13/2012 °ExitCare® Patient Information ©2015 [employer]. This information is not intended to replace advice   given to you by your health care provider. Make sure you discuss any questions you have with your health care provider. ° °

## 2014-04-04 ENCOUNTER — Encounter: Payer: Self-pay | Admitting: Medical

## 2014-04-04 ENCOUNTER — Ambulatory Visit (INDEPENDENT_AMBULATORY_CARE_PROVIDER_SITE_OTHER): Payer: Medicaid Other | Admitting: Medical

## 2014-04-04 NOTE — Progress Notes (Signed)
Patient ID: Kayla Reilly, female   DOB: 06/13/1985, 29 y.o.   MRN: [id number] Subjective:     Kayla DelShikira Widmann is a 29 y.o. female who presents for a postpartum visit. She is 5 weeks postpartum following a spontaneous vaginal delivery. I have fully reviewed the prenatal and intrapartum course. The delivery was at 40.1 gestational weeks. Outcome: spontaneous vaginal delivery. Anesthesia: epidural. Postpartum course has been normal. Baby's course has been normal. Baby is feeding by breast. Bleeding no bleeding. Bowel function is normal. Bladder function is normal. Patient is not sexually active. Contraception method is none. Postpartum depression screening: negative.  The following portions of the patient's history were reviewed and updated as appropriate: allergies, current medications, past family history, past medical history, past social history, past surgical history and problem list.  Review of Systems Pertinent items are noted in HPI.   Objective:    BP 112/59 mmHg  Pulse 81  Temp(Src) 98.9 F (37.2 C)  Ht 5\' 5"  (1.651 m)  Wt 178 lb 6.4 oz (80.922 kg)  BMI 29.69 kg/m2  Breastfeeding? Yes  General:  alert and cooperative   Breasts:  not performed  Lungs: clear to auscultation bilaterally  Heart:  regular rate and rhythm, S1, S2 normal, no murmur, click, rub or gallop  Abdomen: soft, non-tender; bowel sounds normal; no masses,  no organomegaly   Vulva:  normal  Vagina: normal vagina and small amount of thin, white discharge noted  Cervix:  no cervical motion tenderness, no lesions and normal contour  Corpus: normal size, contour, position, consistency, mobility, non-tender  Adnexa:  normal adnexa and no mass, fullness, tenderness  Rectal Exam: Not performed.        Assessment:     Normal postpartum exam. Pap smear not done at today's visit. Last pap smear was 01/01/13 and normal. Next Pap smear due 12/2015  Plan:    1. Contraception: none. Discussed all birth control options  with patient. Does not wish to initiate today 2. Follow up in: 1 year for annual exam  or as needed.

## 2014-04-04 NOTE — Patient Instructions (Signed)
Contraception Choices Contraception (birth control) is the use of any methods or devices to prevent pregnancy. Below are some methods to help avoid pregnancy. HORMONAL METHODS   Contraceptive implant. This is a thin, plastic tube containing progesterone hormone. It does not contain estrogen hormone. Your health care provider inserts the tube in the inner part of the upper arm. The tube can remain in place for up to 3 years. After 3 years, the implant must be removed. The implant prevents the ovaries from releasing an egg (ovulation), thickens the cervical mucus to prevent sperm from entering the uterus, and thins the lining of the inside of the uterus.  Progesterone-only injections. These injections are given every 3 months by your health care provider to prevent pregnancy. This synthetic progesterone hormone stops the ovaries from releasing eggs. It also thickens cervical mucus and changes the uterine lining. This makes it harder for sperm to survive in the uterus.  Birth control pills. These pills contain estrogen and progesterone hormone. They work by preventing the ovaries from releasing eggs (ovulation). They also cause the cervical mucus to thicken, preventing the sperm from entering the uterus. Birth control pills are prescribed by a health care provider.Birth control pills can also be used to treat heavy periods.  Minipill. This type of birth control pill contains only the progesterone hormone. They are taken every day of each month and must be prescribed by your health care provider.  Birth control patch. The patch contains hormones similar to those in birth control pills. It must be changed once a week and is prescribed by a health care provider.  Vaginal ring. The ring contains hormones similar to those in birth control pills. It is left in the vagina for 3 weeks, removed for 1 week, and then a new one is put back in place. The patient must be comfortable inserting and removing the ring  from the vagina.A health care provider's prescription is necessary.  Emergency contraception. Emergency contraceptives prevent pregnancy after unprotected sexual intercourse. This pill can be taken right after sex or up to 5 days after unprotected sex. It is most effective the sooner you take the pills after having sexual intercourse. Most emergency contraceptive pills are available without a prescription. Check with your pharmacist. Do not use emergency contraception as your only form of birth control. BARRIER METHODS   Female condom. This is a thin sheath (latex or rubber) that is worn over the penis during sexual intercourse. It can be used with spermicide to increase effectiveness.  Female condom. This is a soft, loose-fitting sheath that is put into the vagina before sexual intercourse.  Diaphragm. This is a soft, latex, dome-shaped barrier that must be fitted by a health care provider. It is inserted into the vagina, along with a spermicidal jelly. It is inserted before intercourse. The diaphragm should be left in the vagina for 6 to 8 hours after intercourse.  Cervical cap. This is a round, soft, latex or plastic cup that fits over the cervix and must be fitted by a health care provider. The cap can be left in place for up to 48 hours after intercourse.  Sponge. This is a soft, circular piece of polyurethane foam. The sponge has spermicide in it. It is inserted into the vagina after wetting it and before sexual intercourse.  Spermicides. These are chemicals that kill or block sperm from entering the cervix and uterus. They come in the form of creams, jellies, suppositories, foam, or tablets. They do not require a   prescription. They are inserted into the vagina with an applicator before having sexual intercourse. The process must be repeated every time you have sexual intercourse. INTRAUTERINE CONTRACEPTION  Intrauterine device (IUD). This is a T-shaped device that is put in a woman's uterus  during a menstrual period to prevent pregnancy. There are 2 types:  Copper IUD. This type of IUD is wrapped in copper wire and is placed inside the uterus. Copper makes the uterus and fallopian tubes produce a fluid that kills sperm. It can stay in place for 10 years.  Hormone IUD. This type of IUD contains the hormone progestin (synthetic progesterone). The hormone thickens the cervical mucus and prevents sperm from entering the uterus, and it also thins the uterine lining to prevent implantation of a fertilized egg. The hormone can weaken or kill the sperm that get into the uterus. It can stay in place for 3-5 years, depending on which type of IUD is used. PERMANENT METHODS OF CONTRACEPTION  Female tubal ligation. This is when the woman's fallopian tubes are surgically sealed, tied, or blocked to prevent the egg from traveling to the uterus.  Hysteroscopic sterilization. This involves placing a small coil or insert into each fallopian tube. Your doctor uses a technique called hysteroscopy to do the procedure. The device causes scar tissue to form. This results in permanent blockage of the fallopian tubes, so the sperm cannot fertilize the egg. It takes about 3 months after the procedure for the tubes to become blocked. You must use another form of birth control for these 3 months.  Female sterilization. This is when the female has the tubes that carry sperm tied off (vasectomy).This blocks sperm from entering the vagina during sexual intercourse. After the procedure, the man can still ejaculate fluid (semen). NATURAL PLANNING METHODS  Natural family planning. This is not having sexual intercourse or using a barrier method (condom, diaphragm, cervical cap) on days the woman could become pregnant.  Calendar method. This is keeping track of the length of each menstrual cycle and identifying when you are fertile.  Ovulation method. This is avoiding sexual intercourse during ovulation.  Symptothermal  method. This is avoiding sexual intercourse during ovulation, using a thermometer and ovulation symptoms.  Post-ovulation method. This is timing sexual intercourse after you have ovulated. Regardless of which type or method of contraception you choose, it is important that you use condoms to protect against the transmission of sexually transmitted infections (STIs). Talk with your health care provider about which form of contraception is most appropriate for you. Document Released: 02/18/2005 Document Revised: 02/23/2013 Document Reviewed: 08/13/2012 [employer] Patient Information 2015 [employer]. This information is not intended to replace advice given to you by your health care provider. Make sure you discuss any questions you have with your health care provider.  

## 2016-11-04 ENCOUNTER — Encounter (HOSPITAL_COMMUNITY): Payer: Self-pay | Admitting: Emergency Medicine

## 2016-11-04 ENCOUNTER — Other Ambulatory Visit: Payer: Self-pay

## 2016-11-04 ENCOUNTER — Emergency Department (HOSPITAL_COMMUNITY): Payer: Self-pay

## 2016-11-04 ENCOUNTER — Emergency Department (HOSPITAL_COMMUNITY)
Admission: EM | Admit: 2016-11-04 | Discharge: 2016-11-04 | Disposition: A | Discharge status: Home / Self Care | Payer: Self-pay | Attending: Emergency Medicine | Admitting: Emergency Medicine

## 2016-11-04 DIAGNOSIS — R0789 Other chest pain: Secondary | ICD-10-CM | POA: Insufficient documentation

## 2016-11-04 LAB — BASIC METABOLIC PANEL
Anion gap: 8 (ref 5–15)
BUN: 8 mg/dL (ref 6–20)
CHLORIDE: 108 mmol/L (ref 101–111)
CO2: 21 mmol/L — ABNORMAL LOW (ref 22–32)
Calcium: 8.8 mg/dL — ABNORMAL LOW (ref 8.9–10.3)
Creatinine, Ser: 0.65 mg/dL (ref 0.44–1.00)
GFR calc Af Amer: >60 mL/min (ref >60)
GFR calc non Af Amer: >60 mL/min (ref >60)
Glucose, Bld: 92 mg/dL (ref 65–99)
POTASSIUM: 3.1 mmol/L — AB (ref 3.5–5.1)
Sodium: 137 mmol/L (ref 135–145)

## 2016-11-04 LAB — CBC
HEMATOCRIT: 36.6 % (ref 36.0–46.0)
Hemoglobin: 12.5 g/dL (ref 12.0–15.0)
MCH: 29.5 pg (ref 26.0–34.0)
MCHC: 34.2 g/dL (ref 30.0–36.0)
MCV: 86.3 fL (ref 78.0–100.0)
Platelets: 266 10*3/uL (ref 150–400)
RBC: 4.24 MIL/uL (ref 3.87–5.11)
RDW: 12.4 % (ref 11.5–15.5)
WBC: 7.6 10*3/uL (ref 4.0–10.5)

## 2016-11-04 LAB — I-STAT BETA HCG BLOOD, ED (NOT ORDERABLE): I-stat hCG, quantitative: <5 m[IU]/mL (ref <5)

## 2016-11-04 LAB — I-STAT TROPONIN, ED: Troponin i, poc: 0 ng/mL (ref 0.00–0.08)

## 2016-11-04 MED ORDER — NAPROXEN 500 MG PO TABS
500.0000 mg | ORAL_TABLET | Freq: Once | ORAL | Status: AC
  Administered 2016-11-04: 500 mg via ORAL
  Filled 2016-11-04: qty 1

## 2016-11-04 MED ORDER — NAPROXEN 500 MG PO TABS: 500.0000 mg | ORAL_TABLET | Freq: Two times a day (BID) | ORAL | 0 refills | Status: AC

## 2016-11-04 NOTE — ED Notes (Signed)
PT DISCHARGED. INSTRUCTIONS AND PRESCRIPTION GIVEN. AAOX4. PT IN NO APPARENT DISTRESS OR PAIN. THE OPPORTUNITY TO ASK QUESTIONS WAS PROVIDED. 

## 2016-11-04 NOTE — ED Provider Notes (Signed)
[facility]-EMERGENCY DEPT Provider Note   CSN: [id number] Arrival date & time: 11/04/16  0013     History   Chief Complaint Chief Complaint  Patient presents with  . Chest Pain    HPI Kayla Reilly is a 31 y.o. female.  HPI  This is a 31 year old female who presents with chest pain. She reports a one-day history of worsening anterior chest pain. Nonradiating. Dull in nature. At times is intense but currently is 4 out of 10. She's not taken anything for the pain. It is worse with some movements. Denies shortness of breath, cough, fever. Denies sweating. No recent travel or hospitalization. No history of DVTs. Denies use of estrogen products.  Past Medical History:  Diagnosis Date  . Abnormal Pap smear    prior  . Acne vulgaris   . Medical history non-contributory     Patient Active Problem List   Diagnosis Date Noted  . Active labor at term 02/28/2014  . Obesity, unspecified 01/01/2013  . Acne 04/15/2011  . Screen for STD (sexually transmitted disease) 04/15/2011  . Routine general medical examination at a health care facility 04/15/2011  . HPV vaccine counseling 04/15/2011  . History of abnormal Pap smear 04/15/2011    Past Surgical History:  Procedure Laterality Date  . COLPOSCOPY    . ROOT CANAL      OB History    Gravida Para Term Preterm AB Living   1 1 1  0 0 1   SAB TAB Ectopic Multiple Live Births   0 0 0 0 1       Home Medications    Prior to Admission medications   Medication Sig Start Date End Date Taking? Authorizing Provider  naproxen (NAPROSYN) 500 MG tablet Take 1 tablet (500 mg total) by mouth 2 (two) times daily. 11/04/16   Syaire Saber, Mayer Maskerourtney F, MD    Family History Family History  Problem Relation Age of Onset  . Hypertension Mother   . Diabetes Mother   . Cancer Maternal Grandmother        cancer    Social History Social History  Substance Use Topics  . Smoking status: Never Smoker  . Smokeless tobacco: Never Used  . Alcohol use No       Allergies   Patient has no known allergies.   Review of Systems Review of Systems  Constitutional: Negative for fever.  Respiratory: Negative for shortness of breath.   Cardiovascular: Positive for chest pain. Negative for leg swelling.  Gastrointestinal: Negative for abdominal pain, nausea and vomiting.  Genitourinary: Negative for dysuria.  All other systems reviewed and are negative.    Physical Exam Updated Vital Signs BP 122/85 (BP Location: Left Arm)   Pulse 87   Temp 98.1 F (36.7 C) (Oral)   Resp 16   Ht 5\' 6"  (1.676 m)   Wt 88 kg (194 lb)   LMP 10/16/2016   SpO2 100%   BMI 31.31 kg/m   Physical Exam  Constitutional: She is oriented to person, place, and time. She appears well-developed and well-nourished. No distress.  HENT:  Head: Normocephalic and atraumatic.  Cardiovascular: Normal rate, regular rhythm and normal heart sounds.   Pulmonary/Chest: Effort normal. No respiratory distress. She has no wheezes. She exhibits tenderness.  Tenderness palpation anterior chest wall without crepitus  Abdominal: Soft. Bowel sounds are normal. There is no tenderness. There is no guarding.  Neurological: She is alert and oriented to person, place, and time.  Skin: Skin is warm and dry.  Psychiatric: She has a normal mood and affect.  Nursing note and vitals reviewed.    ED Treatments / Results  Labs (all labs ordered are listed, but only abnormal results are displayed) Labs Reviewed  BASIC METABOLIC PANEL - Abnormal; Notable for the following:       Result Value   Potassium 3.1 (*)    CO2 21 (*)    Calcium 8.8 (*)    All other components within normal limits  CBC  I-STAT TROPONIN, ED  I-STAT BETA HCG BLOOD, ED ([facility], [facility], [facility] ONLY)    EKG  EKG Interpretation None       Radiology Dg Chest 2 View  Result Date: 11/04/2016 CLINICAL DATA:  Chest pain intermittent for the last day. EXAM: CHEST  2 VIEW COMPARISON:  None. FINDINGS: The heart size and  mediastinal contours are within normal limits. Both lungs are clear. The visualized skeletal structures are unremarkable. IMPRESSION: No active cardiopulmonary disease. Electronically Signed   By: Tollie Eth M.D.   On: 11/04/2016 01:35    Procedures Procedures (including critical care time)  Medications Ordered in ED Medications  naproxen (NAPROSYN) tablet 500 mg (500 mg Oral Given 11/04/16 0345)     Initial Impression / Assessment and Plan / ED Course  I have reviewed the triage vital signs and the nursing notes.  Pertinent labs & imaging results that were available during my care of the patient were reviewed by me and considered in my medical decision making (see chart for details).     Patient presents with chest pain. Reproducible on exam. Vital signs reassuring. Basic lab work including troponin negative. Doubt ACS. Patient is PERC negative. She was given anti-inflammatory. On recheck she feels much better. Suspect musculoskeletal etiology.  After history, exam, and medical workup I feel the patient has been appropriately medically screened and is safe for discharge home. Pertinent diagnoses were discussed with the patient. Patient was given return precautions.   Final Clinical Impressions(s) / ED Diagnoses   Final diagnoses:  Chest wall pain    New Prescriptions New Prescriptions   NAPROXEN (NAPROSYN) 500 MG TABLET    Take 1 tablet (500 mg total) by mouth 2 (two) times daily.     Shon Baton, MD 11/05/16 [phone]

## 2016-11-04 NOTE — ED Triage Notes (Signed)
Pt reports having chest pain that has been intermittent for the last day. Pt states the pain feels like someone is holding her chest.

## 2020-01-19 ENCOUNTER — Encounter (HOSPITAL_COMMUNITY): Payer: Self-pay

## 2020-01-19 ENCOUNTER — Other Ambulatory Visit: Payer: Self-pay

## 2020-01-19 ENCOUNTER — Emergency Department (HOSPITAL_COMMUNITY)
Admission: EM | Admit: 2020-01-19 | Discharge: 2020-01-20 | Disposition: A | Discharge status: Home / Self Care | Payer: PRIVATE HEALTH INSURANCE | Attending: Emergency Medicine | Admitting: Emergency Medicine

## 2020-01-19 ENCOUNTER — Emergency Department (HOSPITAL_COMMUNITY): Payer: PRIVATE HEALTH INSURANCE

## 2020-01-19 DIAGNOSIS — S61211A Laceration without foreign body of left index finger without damage to nail, initial encounter: Secondary | ICD-10-CM | POA: Insufficient documentation
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
## 2020-01-20 ENCOUNTER — Emergency Department (HOSPITAL_COMMUNITY): Payer: PRIVATE HEALTH INSURANCE

## 2020-01-20 MED ORDER — LIDOCAINE HCL 2 % IJ SOLN
10.0000 mL | Freq: Once | INTRAMUSCULAR | Status: AC
  Administered 2020-01-20: 200 mg
  Filled 2020-01-20: qty 20

## 2020-01-20 MED ORDER — BACITRACIN ZINC 500 UNIT/GM EX OINT
TOPICAL_OINTMENT | CUTANEOUS | Status: AC
  Filled 2020-01-20: qty 0.9

## 2020-01-20 MED ORDER — BACITRACIN ZINC 500 UNIT/GM EX OINT: TOPICAL_OINTMENT | Freq: Two times a day (BID) | CUTANEOUS | Status: DC

## 2020-01-20 NOTE — Discharge Instructions (Signed)
Thank you for allowing me to care for you today in the Emergency Department.   Your sutures need to be removed in 7 days.  You can go to urgent care, primary care, or return to the ER to have them removed.  Take 650 mg of Tylenol or 600 mg of ibuprofen with food every 6 hours for pain.  You can alternate between these 2 medications every 3 hours if your pain returns.  For instance, you can take Tylenol at noon, followed by a dose of ibuprofen at 3, followed by second dose of Tylenol and 6.  To care for your wound at home:  Keep the wound clean and dry for the next 24 hours. Then, you can gently run the wound under warm water and clean it with soap at least once daily.  Pat the area dry and apply a topical antibiotic onto the skin.  Then cover it with a dressing.  Make sure that you keep the finger splint on at all times other than when you are cleaning the wound because if you bend the finger it may require stitches.  You need to make sure that you do not submerge the finger in water until the stitches have been removed as this can increase the risk of infection.  You need to have the wound rechecked if you develop fever, chills, if the finger gets red, hot to the touch, swollen, or if you start to have thick, mucus-like drainage.  You should return to the ER if you have new injury, signs of infection, new numbness or weakness, uncontrollable bleeding, or other new, concerning symptoms.
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
## 2020-01-31 ENCOUNTER — Other Ambulatory Visit: Payer: Self-pay

## 2020-01-31 ENCOUNTER — Ambulatory Visit
Admission: RE | Admit: 2020-01-31 | Discharge: 2020-01-31 | Disposition: A | Discharge status: Home / Self Care | Payer: Self-pay | Source: Ambulatory Visit

## 2020-01-31 DIAGNOSIS — Z4802 Encounter for removal of sutures: Secondary | ICD-10-CM

## 2020-01-31 NOTE — ED Triage Notes (Signed)
Pt here for suture removal from right index finger; 5 sutures removed and wound healing well

## 2021-02-14 IMAGING — CR DG HAND COMPLETE 3+V*L*
3 series · 3 of 3 positions shown · non-contrast
Comparison: None.

CLINICAL DATA: Left hand injury, laceration to the left index
finger

EXAM:
LEFT HAND - COMPLETE 3+ VIEW

[x hand pa left]
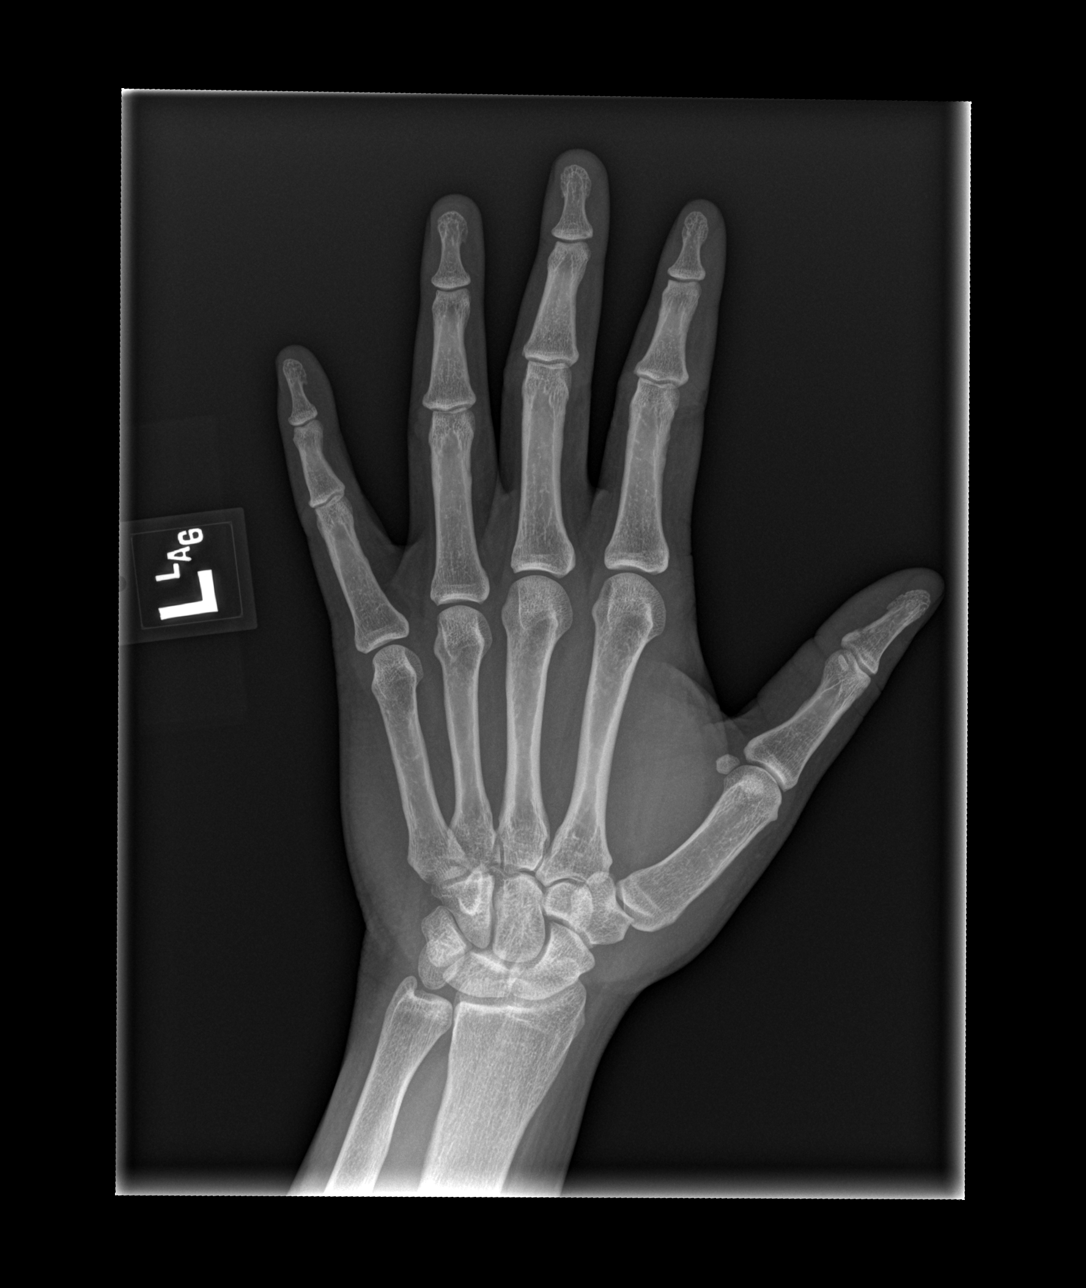

[x hand obl left]
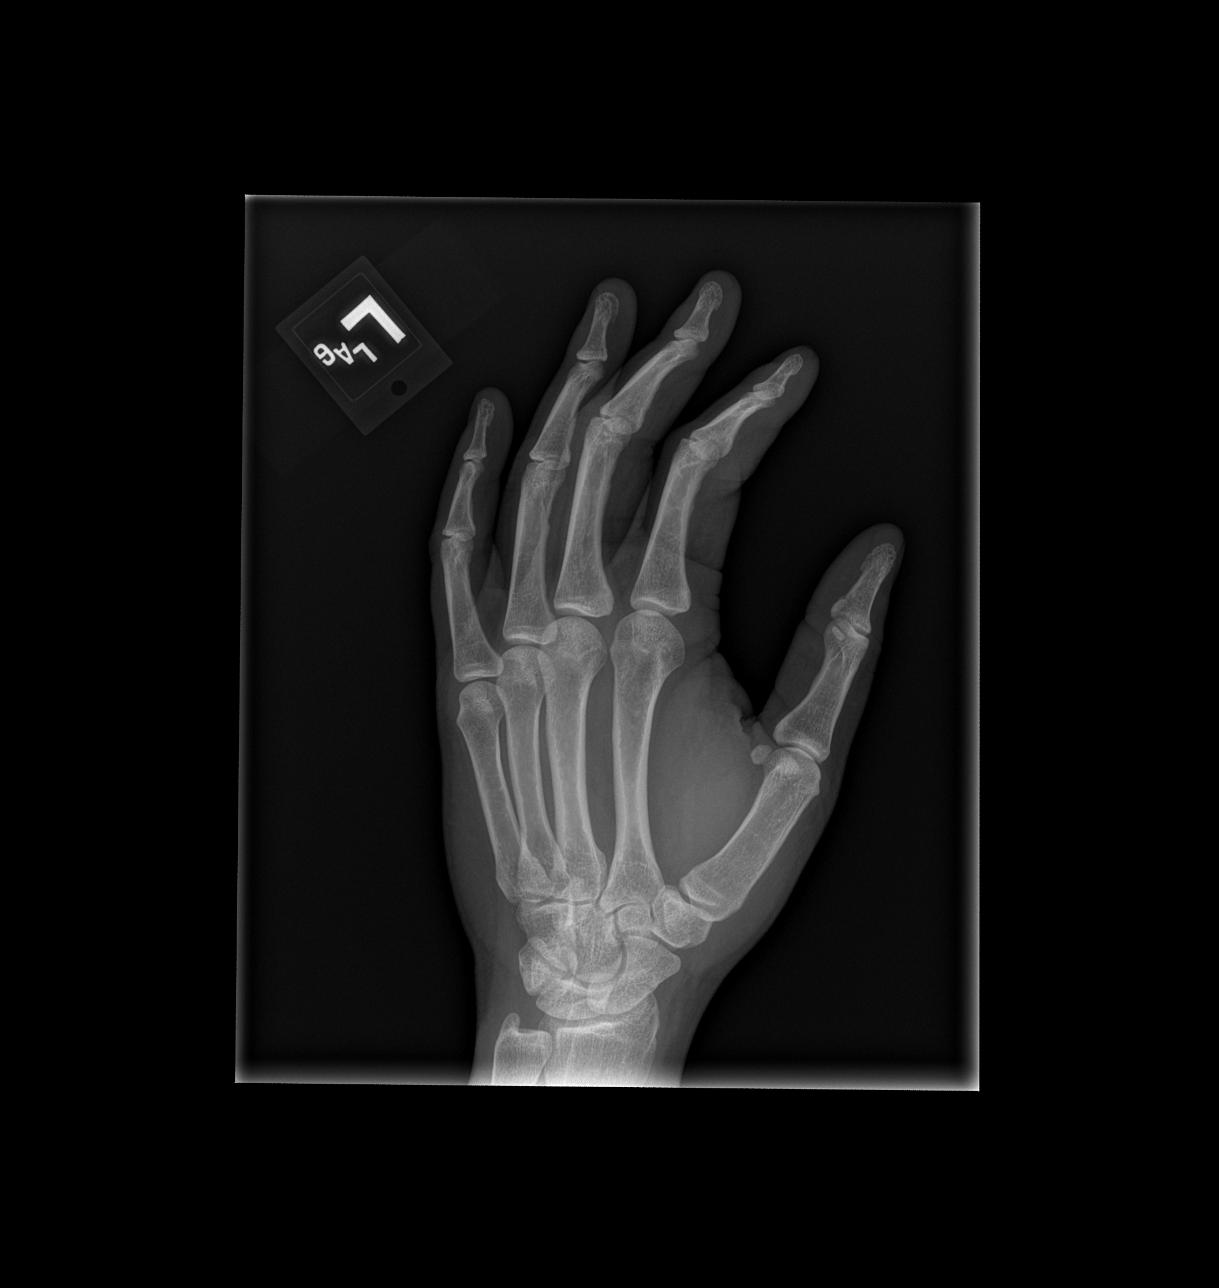

[x hand lat left]
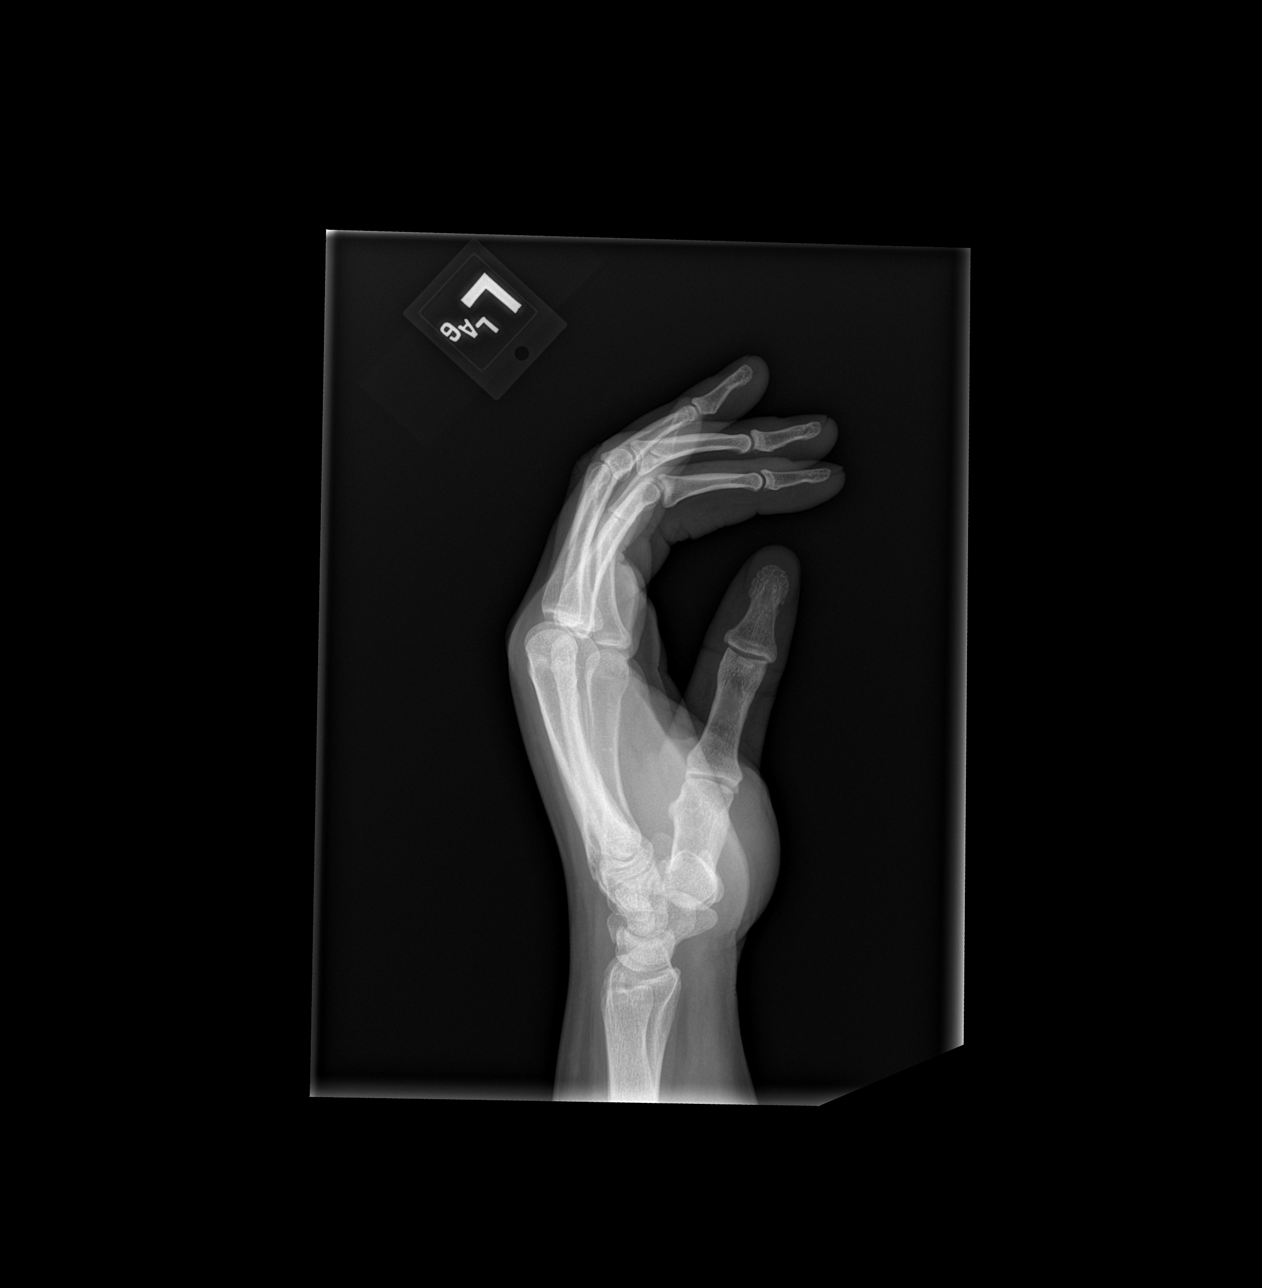

[3 of 3 positions shown; findings below may reference images not displayed]

FINDINGS: Soft tissue irregularity of the radial and volar soft tissues at the
level of the second proximal interphalangeal joint may correspond
with reported site of laceration. No radiopaque foreign body is
seen. No subjacent injury at this location or elsewhere in the hand.
Normal bone mineralization. No worrisome osseous lesions. No
significant arthrosis.
IMPRESSION: Soft tissue irregularity at the level of the second proximal
interphalangeal joint may correspond with reported site of
laceration. No retained foreign body.

No acute osseous abnormality.
